# Patient Record
Sex: Female | Born: 1950 | Race: White | Hispanic: No | Marital: Married | Smoking: Never smoker
Health system: Western US, Academic
[De-identification: ages and names within clinical notes are randomized; demographics above are authoritative.]

---

## 2017-07-18 IMAGING — DX PELVIS 1 VIEW
1 series · 1 of 1 positions shown · non-contrast
Comparison: None

THORACIC SPINE 2 VIEWS, PELVIS 1 VIEW, LUMBAR SPINE 2 VIEW, 07/18/2017 [DATE]: 
CLINICAL INDICATION: 66-year-old female with previous laminectomy, low back 
pain 
and right leg pain
TECHNIQUE: 2 views thoracic spine, 2 views lumbosacral spine, and AP pelvis 
were 
performed

[AP]
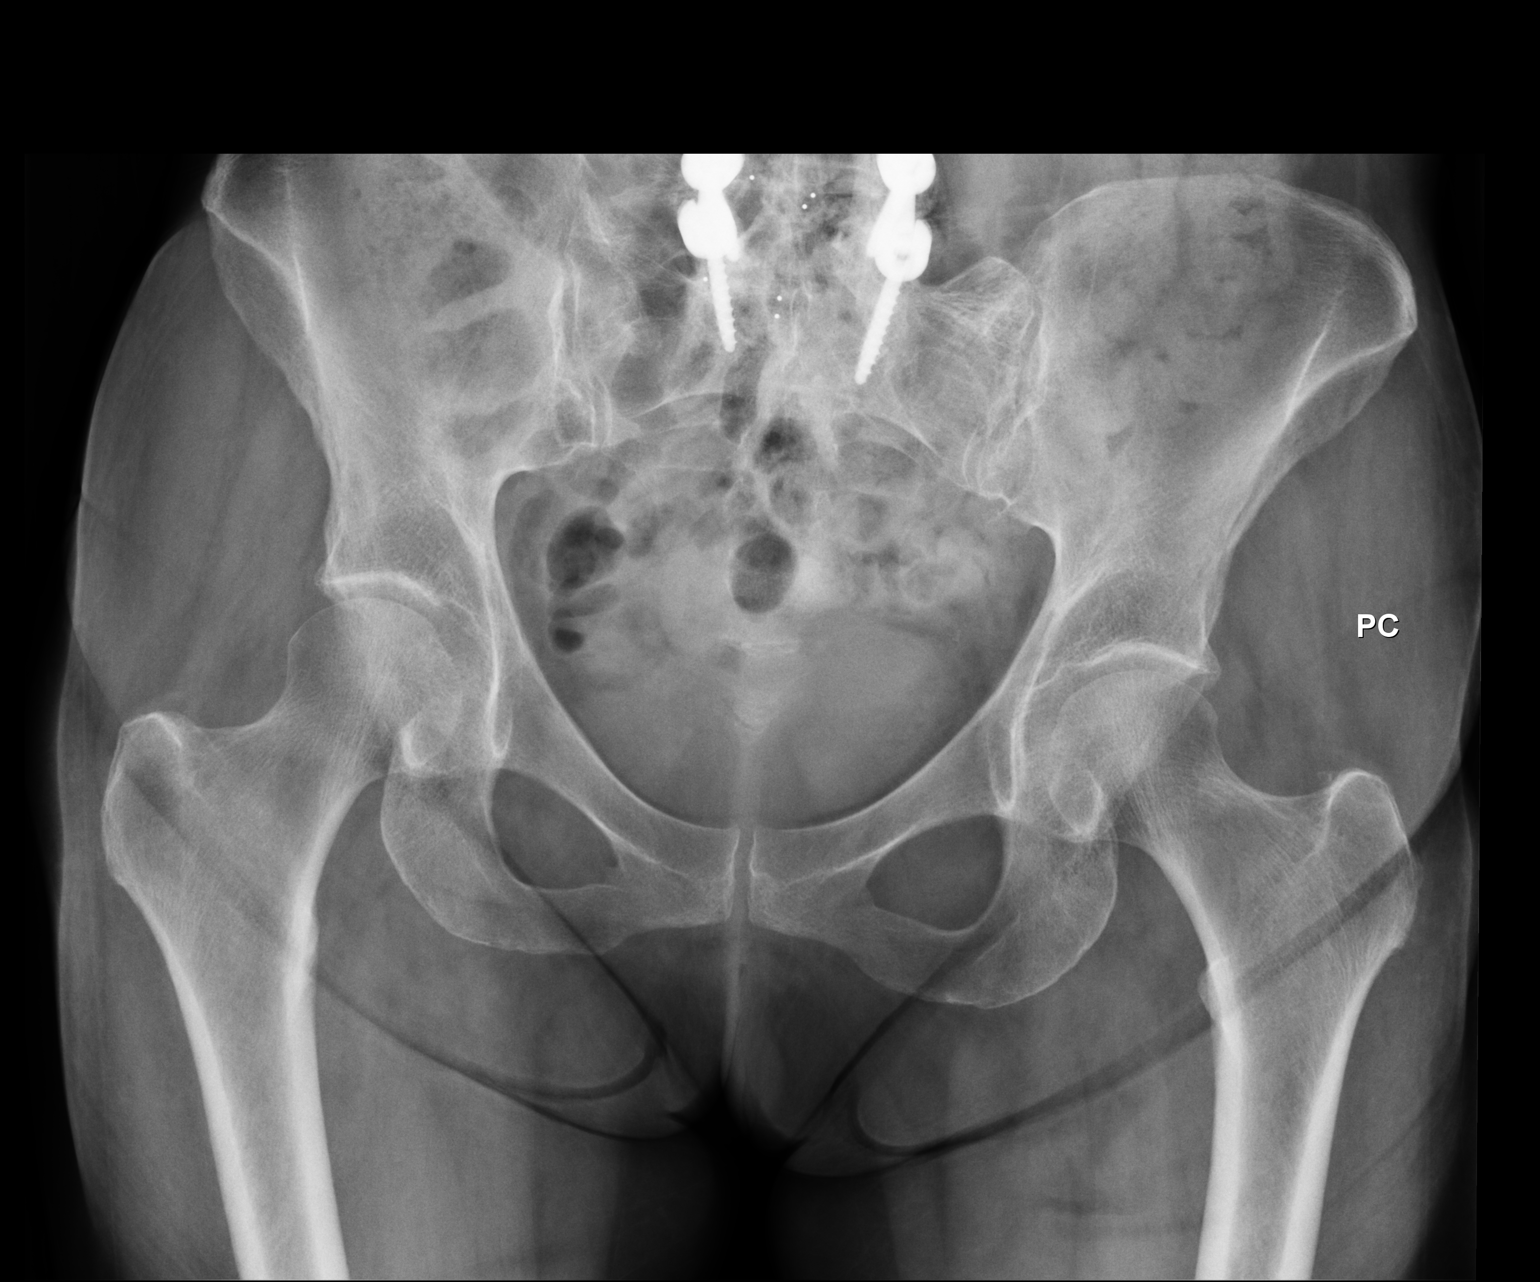

[1 of 1 positions shown; findings below may reference images not displayed]

FINDINGS: Thoracic spine demonstrates minimal dextroscoliosis. 
Vertebral body heights and disc space heights are maintained. 
There is minimal marginal spondylitic change of the dorsal vertebral bodies. 
There is bilateral medullary nephrocalcinosis in both kidneys. 
Imaged lungs are clear. 
Lower cervical spine shows degenerative facet arthritis left greater than 
right. 
2 views lumbosacral spine demonstrate previous posterior decompression and 
transpedicular fusion L3 through S1. There appear to be 6 nonrib-bearing 
lumbar-type vertebral bodies. No hardware failure identified. Intervertebral 
disc spacers are present at L3/L4, L4/L5, and L5/S1. 
No compression fracture. No malalignment. 
SI joints are open. 
AP pelvis demonstrates preserved hip joint spaces. No focal lytic or blastic 
lesion. 
Asymmetry of the lower lumbar spine with partial sacralization of the right 
half 
of the lowest most lumbar vertebral body.
IMPRESSION: Bilateral medullary nephrocalcinosis of the kidneys. Consider further renal 
workup. 
6 nonrib bearing lumbar type vertebral bodies with previous transpedicular 
fusion and posterior decompression from what appears to be L3-S1 or the 3 
lowest 
nonrib bearing type vertebral bodies with disc spacers. No hardware failure. 
Correlation with MRI may be helpful to further assess for the numbering schema 
and the hardware positioning. The lowest or caudal most lumbar nonrib-bearing 
type vertebral body appears to be partially sacralized with slight asymmetry 
of 
the bony pelvis secondarily. 
No acute finding of the thoracic spine. Mild degenerative spondylitic change.

## 2017-07-18 IMAGING — DX HAND 3 VIEWS LEFT
1 series · 3 of 3 positions shown · non-contrast
Comparison: None

HAND 3 VIEWS RIGHT, HAND 3 VIEWS LEFT, 07/18/2017 [DATE]: 
CLINICAL INDICATION: 66-year-old female with right hand pain
TECHNIQUE: 3 views of the bilateral hands were performed

[Series 1: PA · U · 0.14mm/px · 3 of 3 slices shown]
[im 1/3]
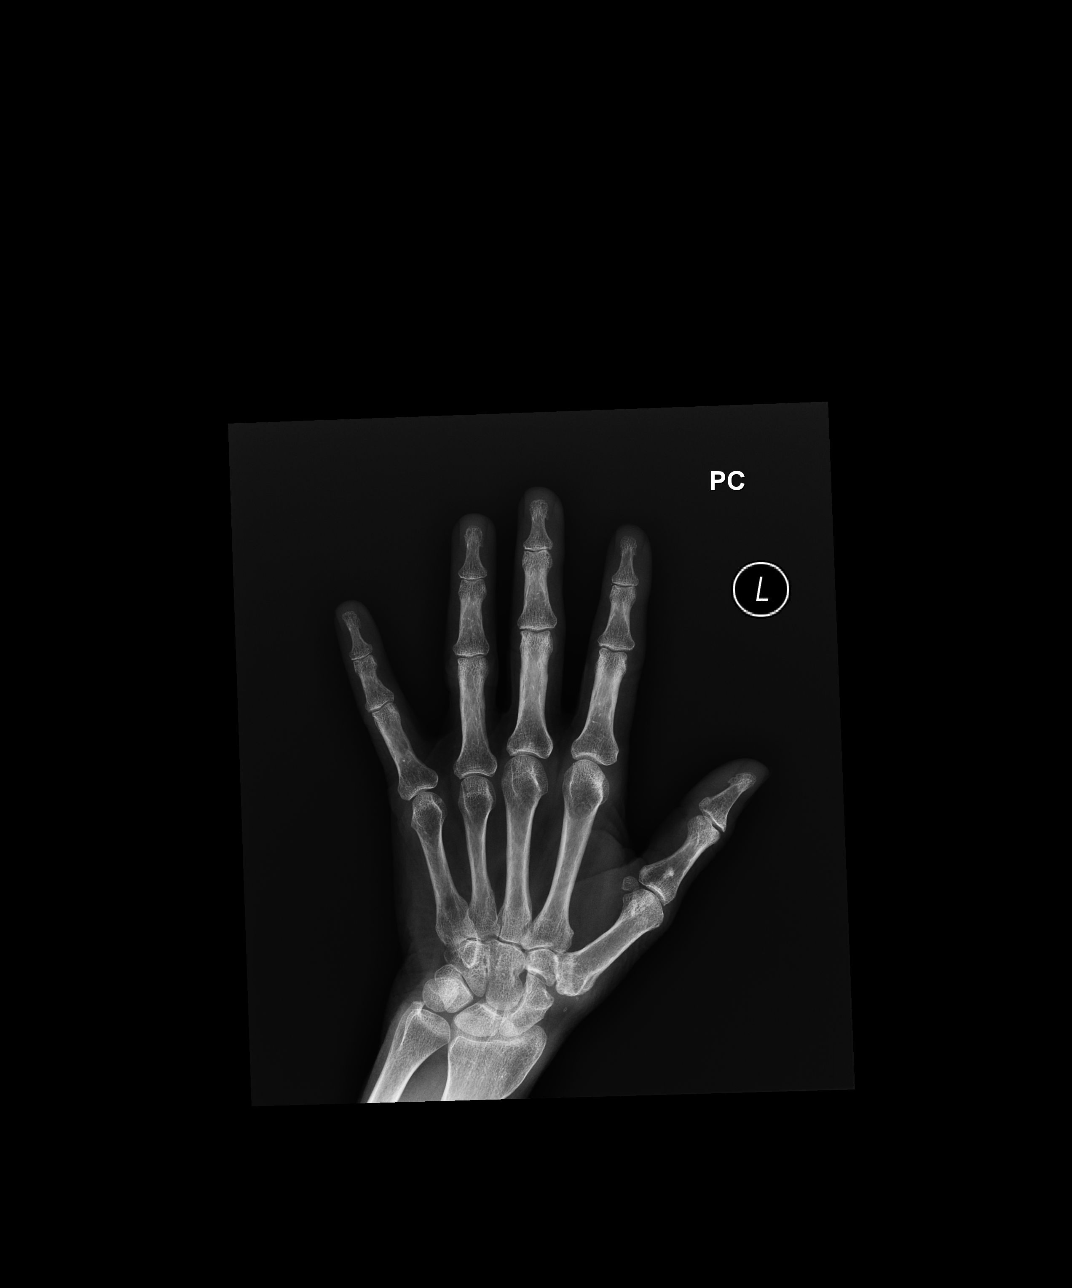
[im 2/3]
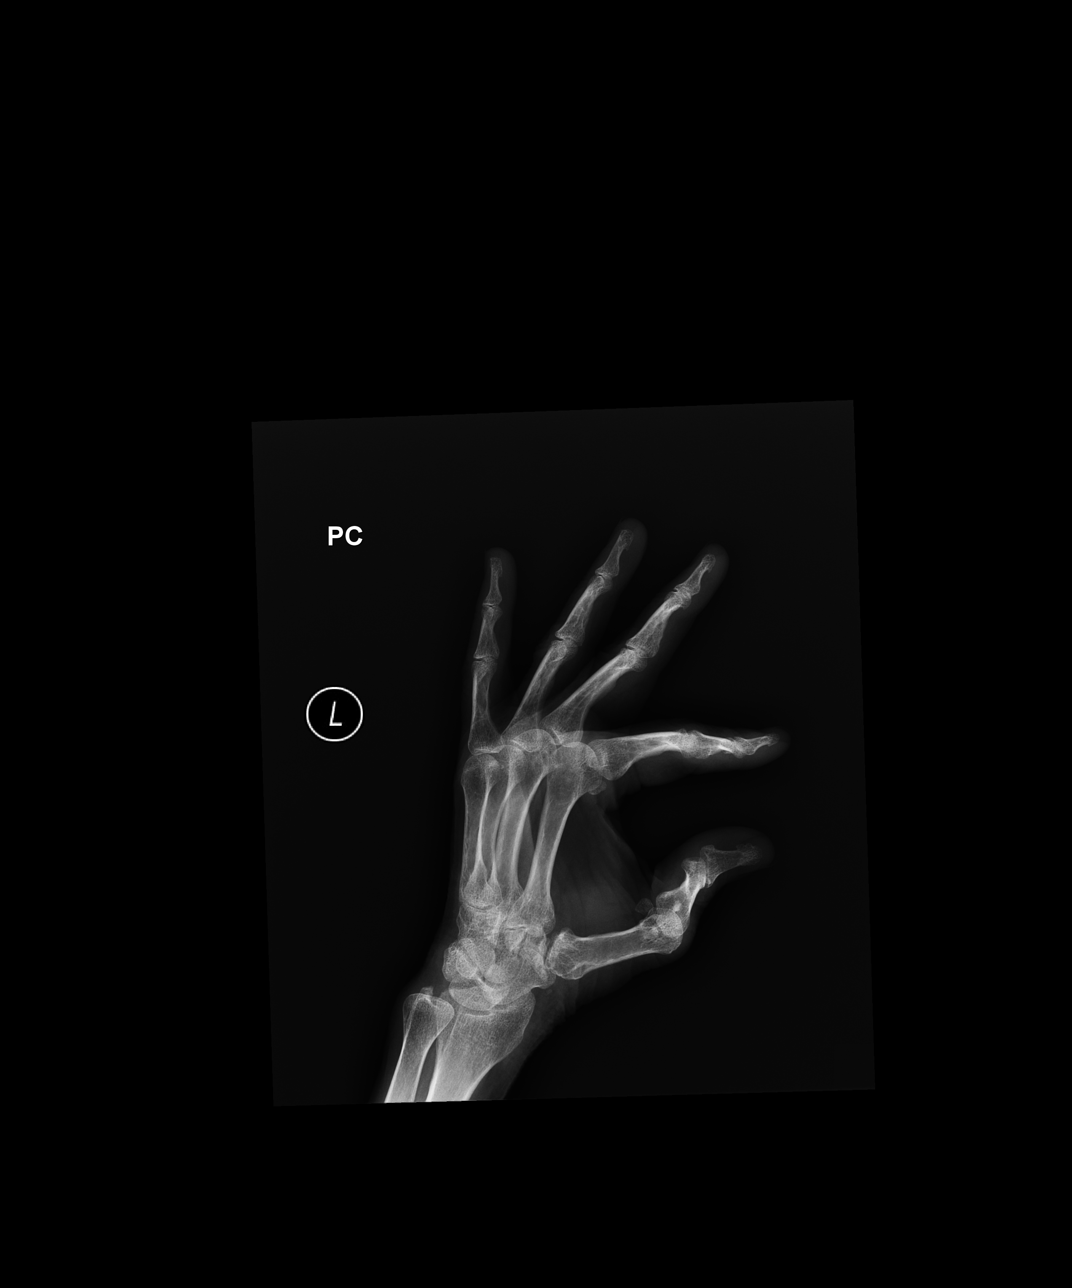
[im 3/3]
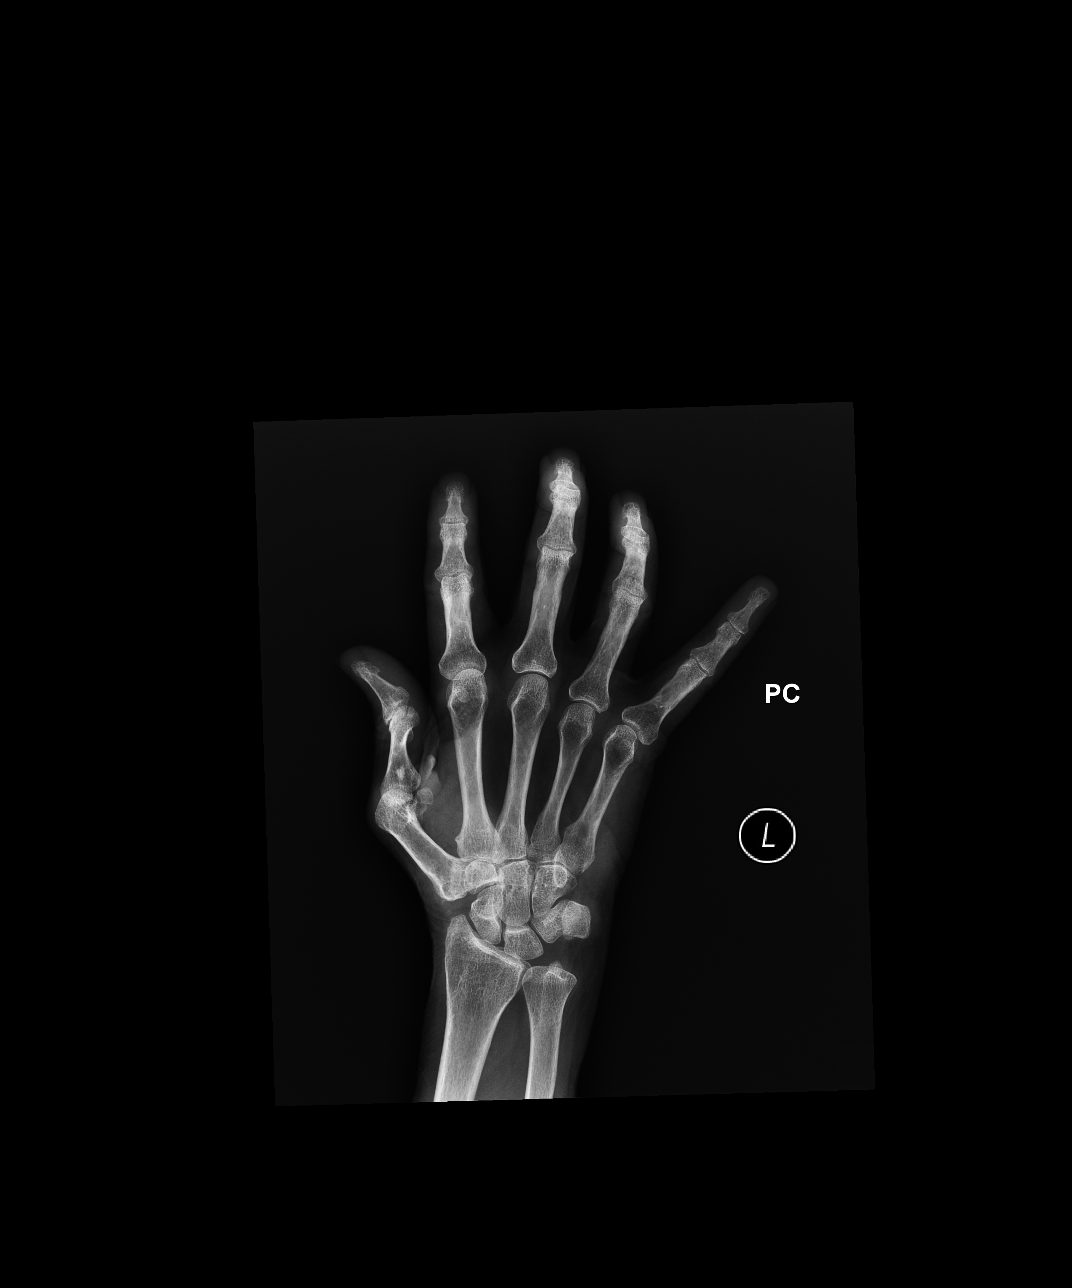

[3 of 3 positions shown; findings below may reference images not displayed]

FINDINGS: There is mild global osteopenia. 
Right hand demonstrates minimal scattered degenerative joint space narrowing 
of 
the second and third DIP and PIP joints. No erosion. 
Mild right first carpometacarpal degenerative arthritis as well. No soft 
tissue 
swelling or subluxation. 
On the left hand, there is minimal second and third DIP and PIP joint base 
narrowing and mild MCP joint space narrowing. No erosion. 
Mild dorsal osteophytic spurring of the third digit at the distal phalanx.
IMPRESSION: Mild degenerative arthritis bilateral hands second and third digits primarily 
and right first carpometacarpal joint. 
No plain film evidence of erosive or inflammatory arthritis.

## 2017-07-18 IMAGING — DX LUMBAR SPINE 2 VIEW
1 series · 2 of 2 positions shown · non-contrast
Comparison: None

THORACIC SPINE 2 VIEWS, PELVIS 1 VIEW, LUMBAR SPINE 2 VIEW, 07/18/2017 [DATE]: 
CLINICAL INDICATION: 66-year-old female with previous laminectomy, low back 
pain 
and right leg pain
TECHNIQUE: 2 views thoracic spine, 2 views lumbosacral spine, and AP pelvis 
were 
performed

[Series 1: AP · U · 0.14mm/px · 2 of 2 slices shown]
[im 1/2]
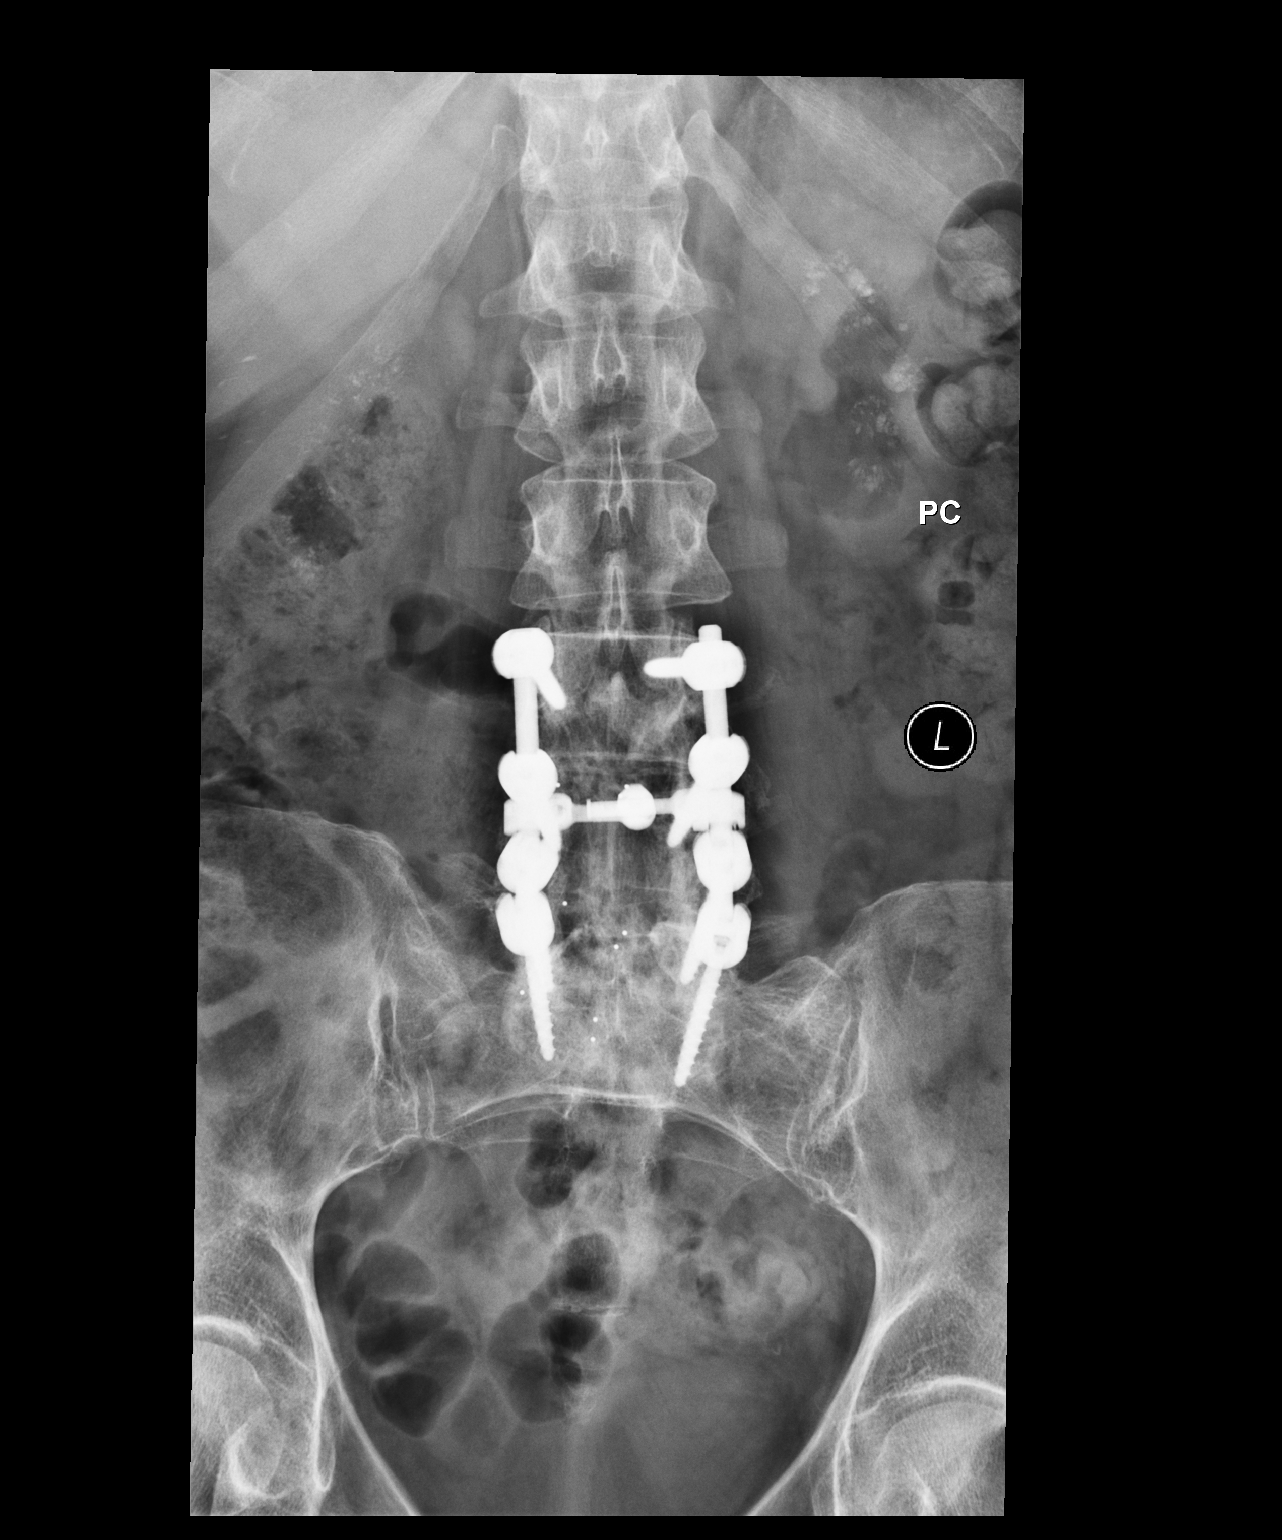
[im 2/2]
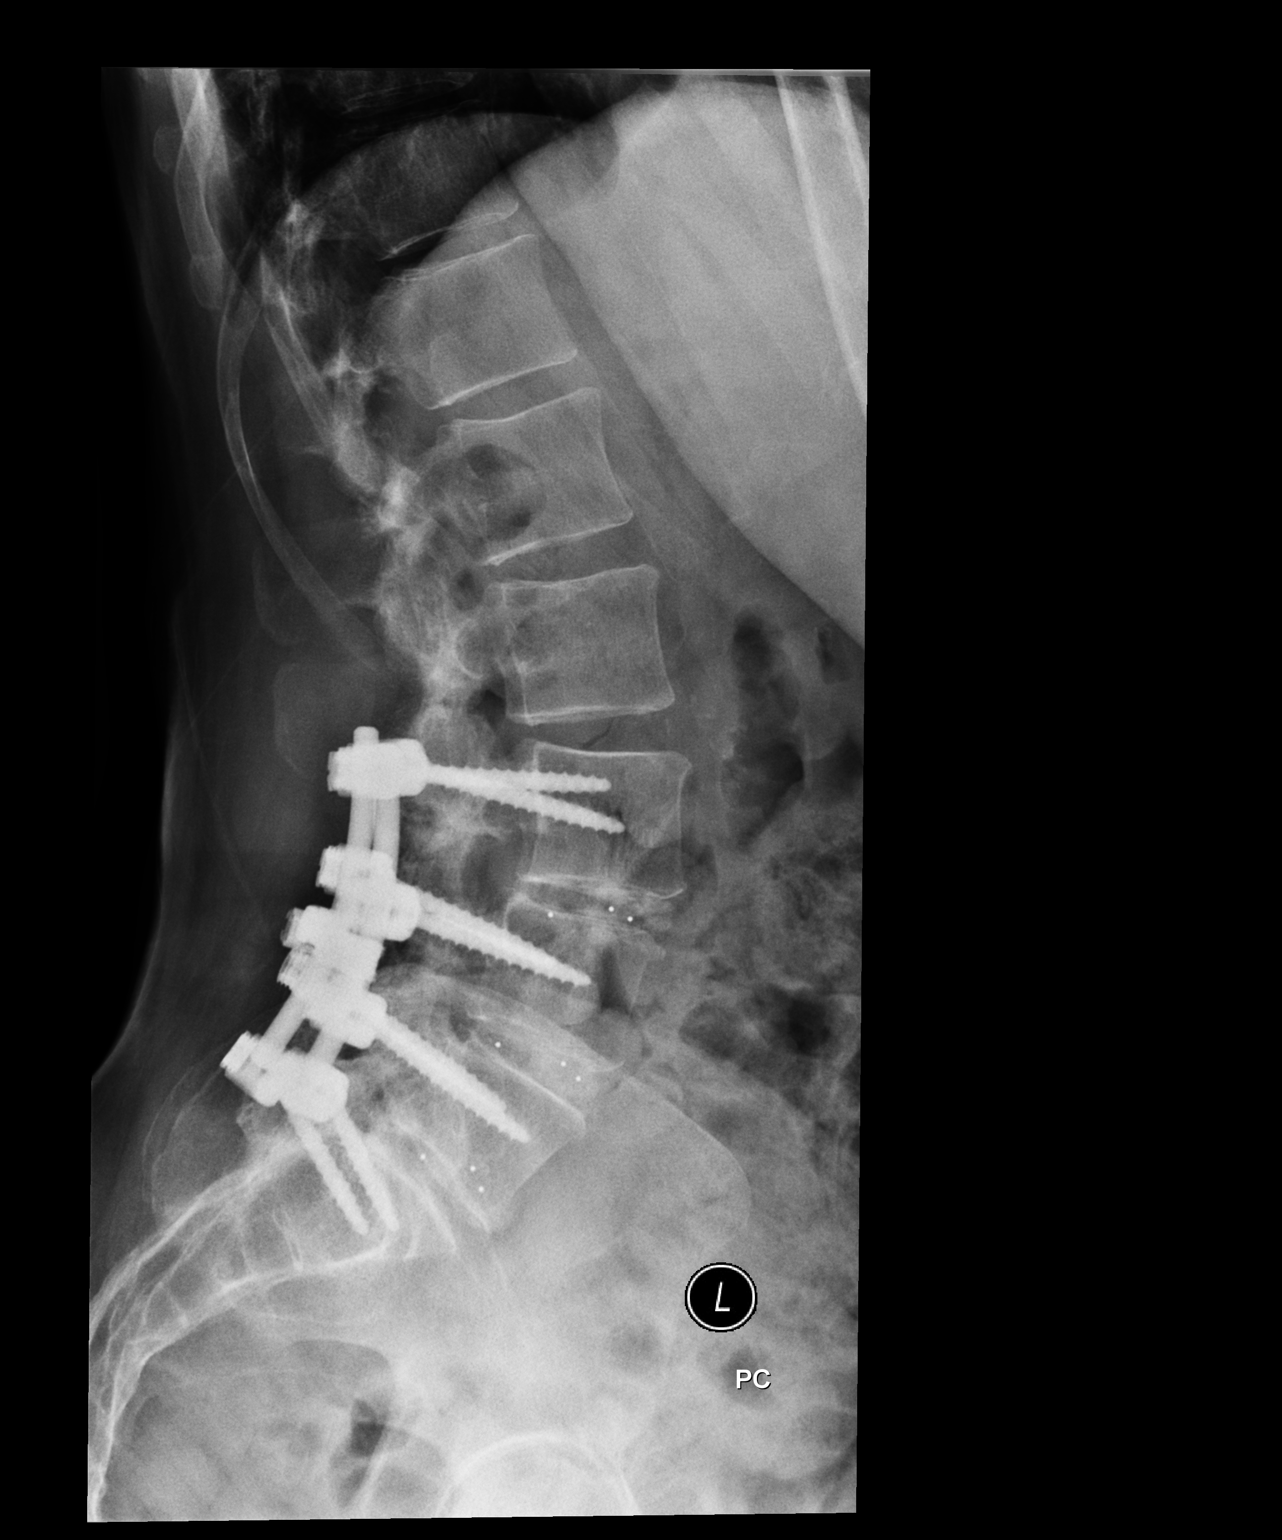

[2 of 2 positions shown; findings below may reference images not displayed]

FINDINGS: Thoracic spine demonstrates minimal dextroscoliosis. 
Vertebral body heights and disc space heights are maintained. 
There is minimal marginal spondylitic change of the dorsal vertebral bodies. 
There is bilateral medullary nephrocalcinosis in both kidneys. 
Imaged lungs are clear. 
Lower cervical spine shows degenerative facet arthritis left greater than 
right. 
2 views lumbosacral spine demonstrate previous posterior decompression and 
transpedicular fusion L3 through S1. There appear to be 6 nonrib-bearing 
lumbar-type vertebral bodies. No hardware failure identified. Intervertebral 
disc spacers are present at L3/L4, L4/L5, and L5/S1. 
No compression fracture. No malalignment. 
SI joints are open. 
AP pelvis demonstrates preserved hip joint spaces. No focal lytic or blastic 
lesion. 
Asymmetry of the lower lumbar spine with partial sacralization of the right 
half 
of the lowest most lumbar vertebral body.
IMPRESSION: Bilateral medullary nephrocalcinosis of the kidneys. Consider further renal 
workup. 
6 nonrib bearing lumbar type vertebral bodies with previous transpedicular 
fusion and posterior decompression from what appears to be L3-S1 or the 3 
lowest 
nonrib bearing type vertebral bodies with disc spacers. No hardware failure. 
Correlation with MRI may be helpful to further assess for the numbering schema 
and the hardware positioning. The lowest or caudal most lumbar nonrib-bearing 
type vertebral body appears to be partially sacralized with slight asymmetry 
of 
the bony pelvis secondarily. 
No acute finding of the thoracic spine. Mild degenerative spondylitic change.

## 2017-07-18 IMAGING — DX HAND 3 VIEWS RIGHT
1 series · 6 of 6 positions shown · non-contrast
Comparison: None

HAND 3 VIEWS RIGHT, HAND 3 VIEWS LEFT, 07/18/2017 [DATE]: 
CLINICAL INDICATION: 66-year-old female with right hand pain
TECHNIQUE: 3 views of the bilateral hands were performed

[Series 1: PA · U · 0.14mm/px · 6 of 6 slices shown]
[im 1/6]
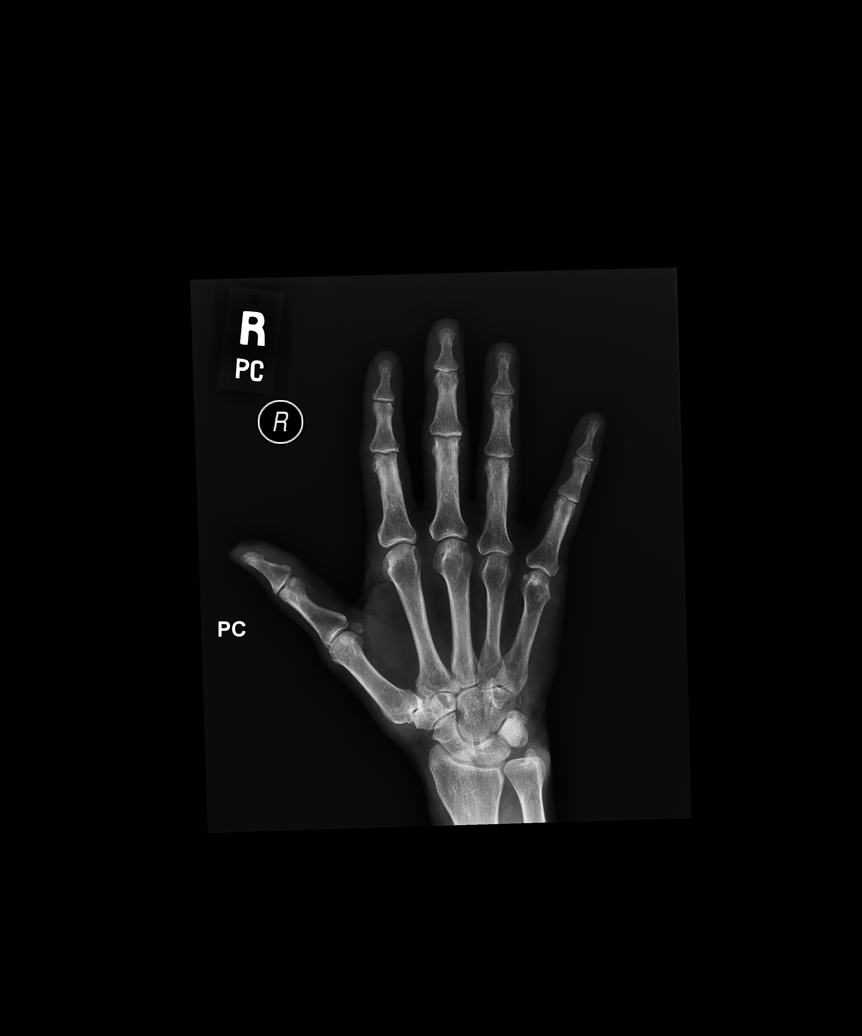
[im 2/6]
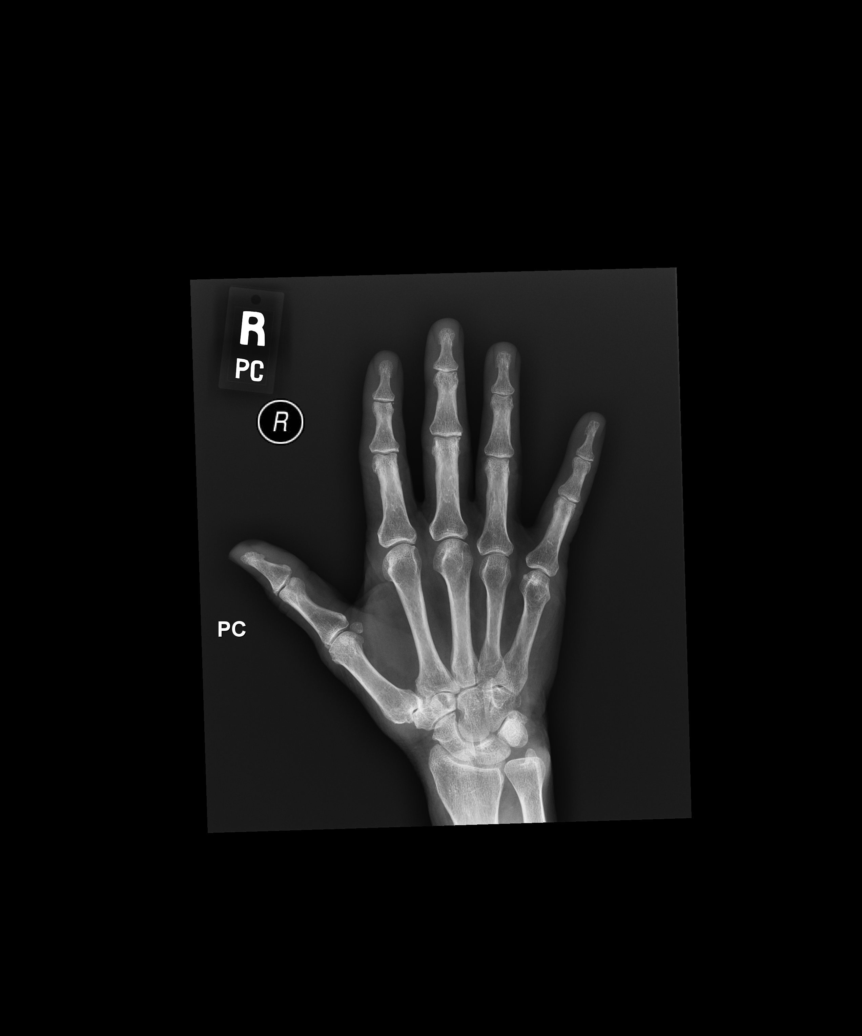
[im 3/6]
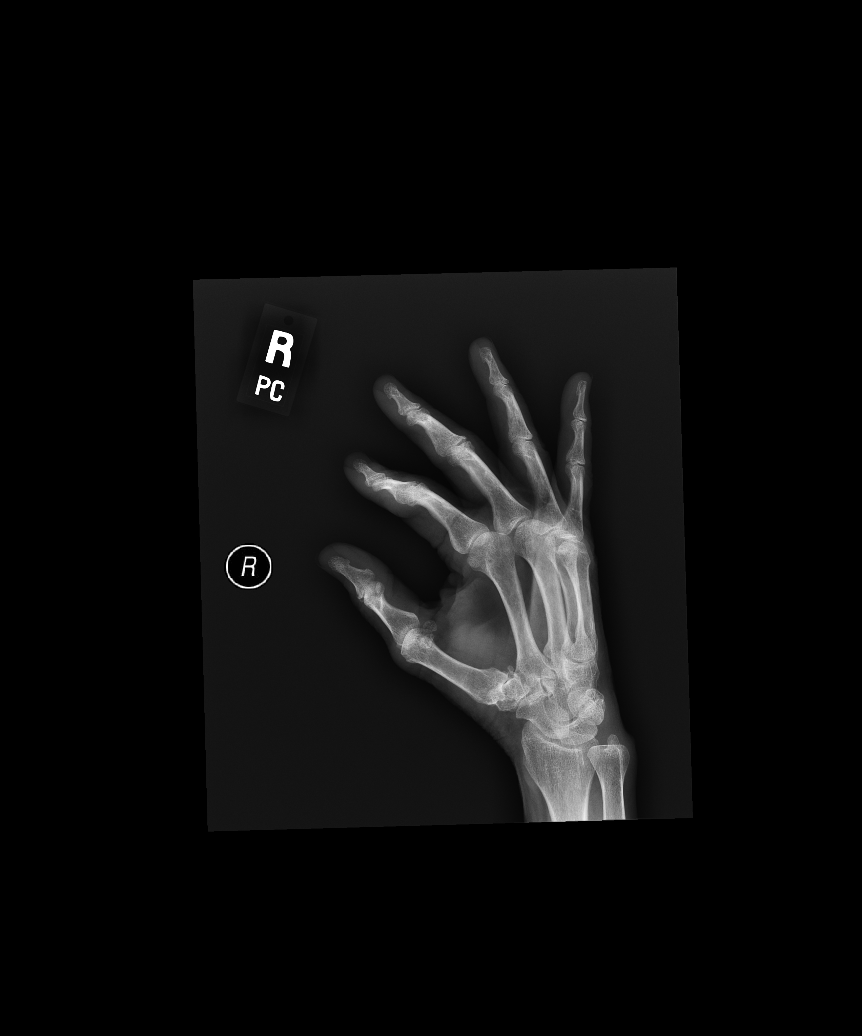
[im 4/6]
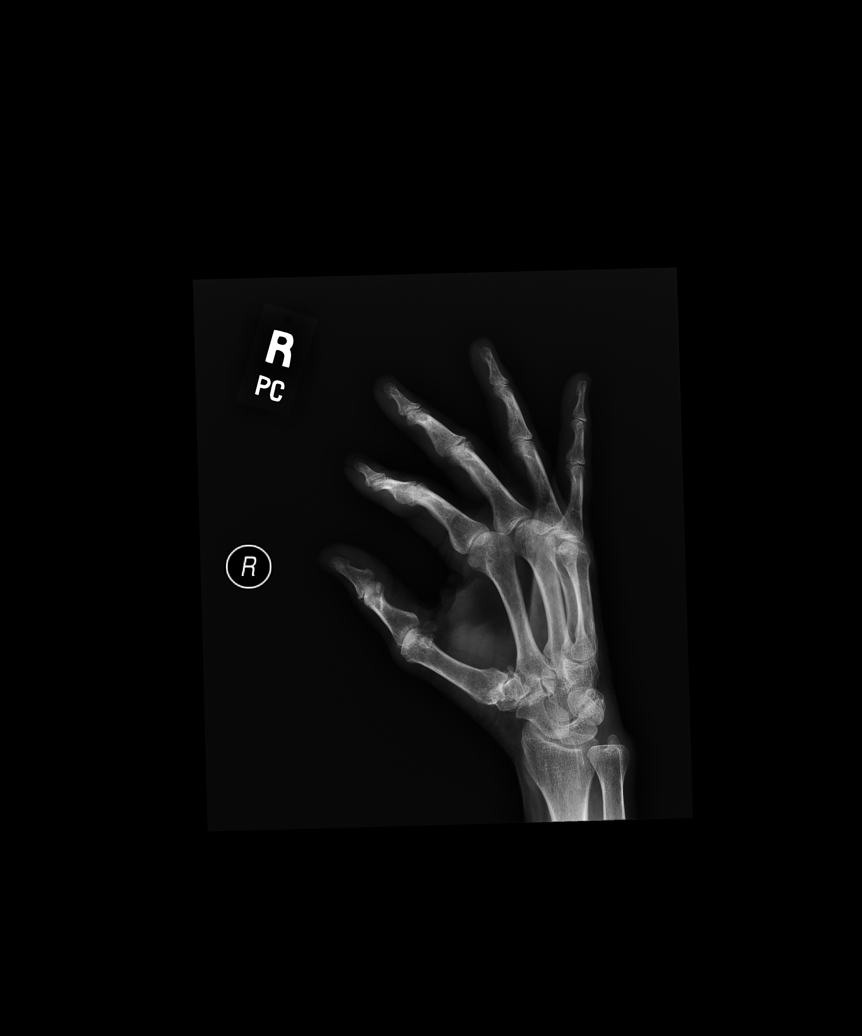
[im 5/6]
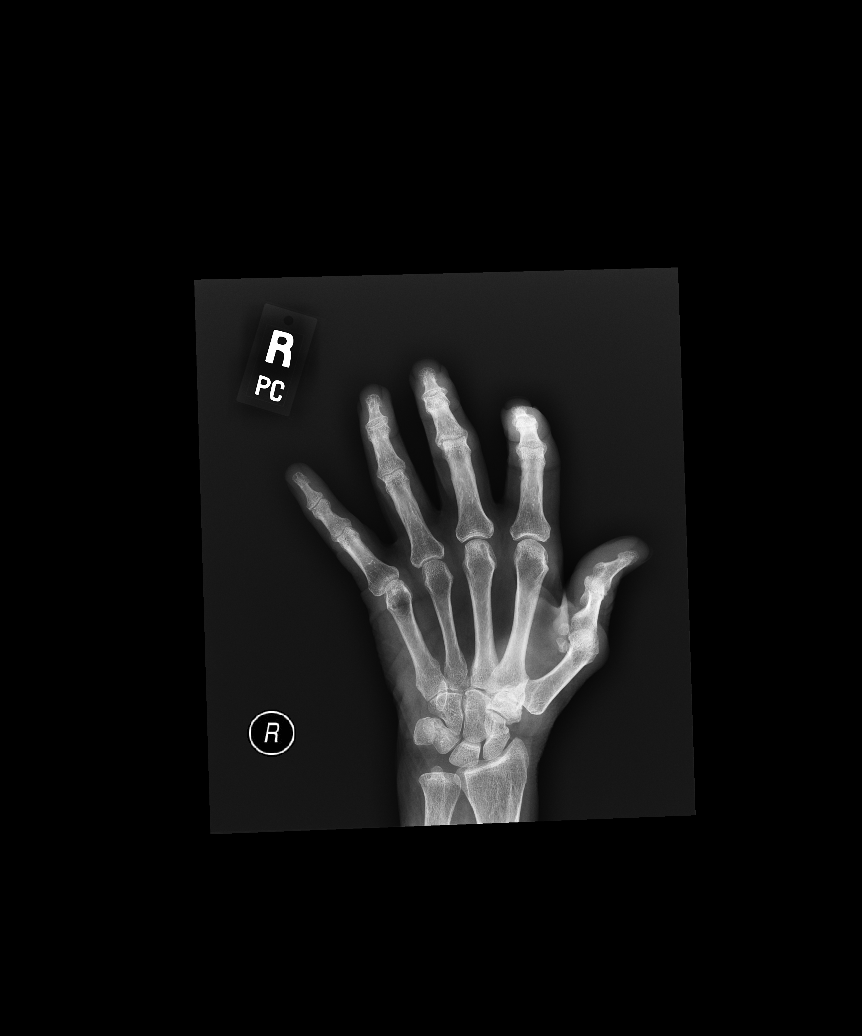
[im 6/6]
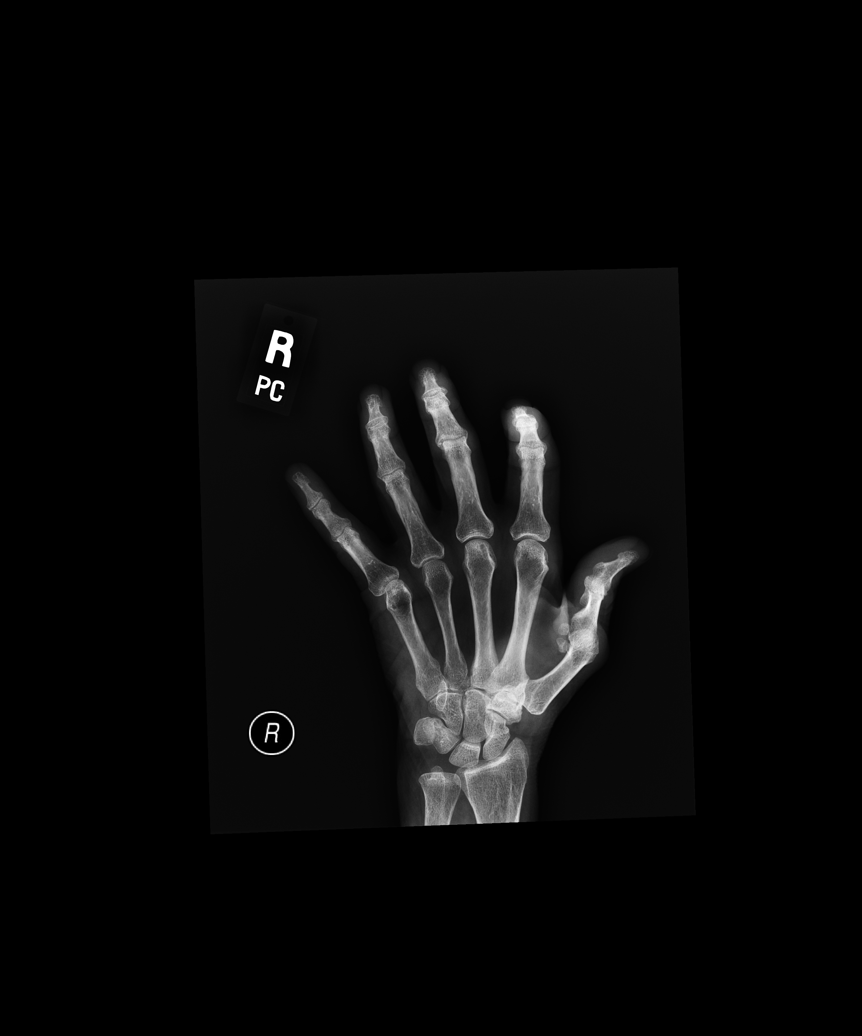

[6 of 6 positions shown; findings below may reference images not displayed]

FINDINGS: There is mild global osteopenia. 
Right hand demonstrates minimal scattered degenerative joint space narrowing 
of 
the second and third DIP and PIP joints. No erosion. 
Mild right first carpometacarpal degenerative arthritis as well. No soft 
tissue 
swelling or subluxation. 
On the left hand, there is minimal second and third DIP and PIP joint base 
narrowing and mild MCP joint space narrowing. No erosion. 
Mild dorsal osteophytic spurring of the third digit at the distal phalanx.
IMPRESSION: Mild degenerative arthritis bilateral hands second and third digits primarily 
and right first carpometacarpal joint. 
No plain film evidence of erosive or inflammatory arthritis.

## 2017-07-18 IMAGING — DX THORACIC SPINE 2 VIEWS
1 series · 4 of 4 positions shown · non-contrast
Comparison: None

THORACIC SPINE 2 VIEWS, PELVIS 1 VIEW, LUMBAR SPINE 2 VIEW, 07/18/2017 [DATE]: 
CLINICAL INDICATION: 66-year-old female with previous laminectomy, low back 
pain 
and right leg pain
TECHNIQUE: 2 views thoracic spine, 2 views lumbosacral spine, and AP pelvis 
were 
performed

[Series 1: AP · U · 0.14mm/px · 4 of 4 slices shown]
[im 1/4]
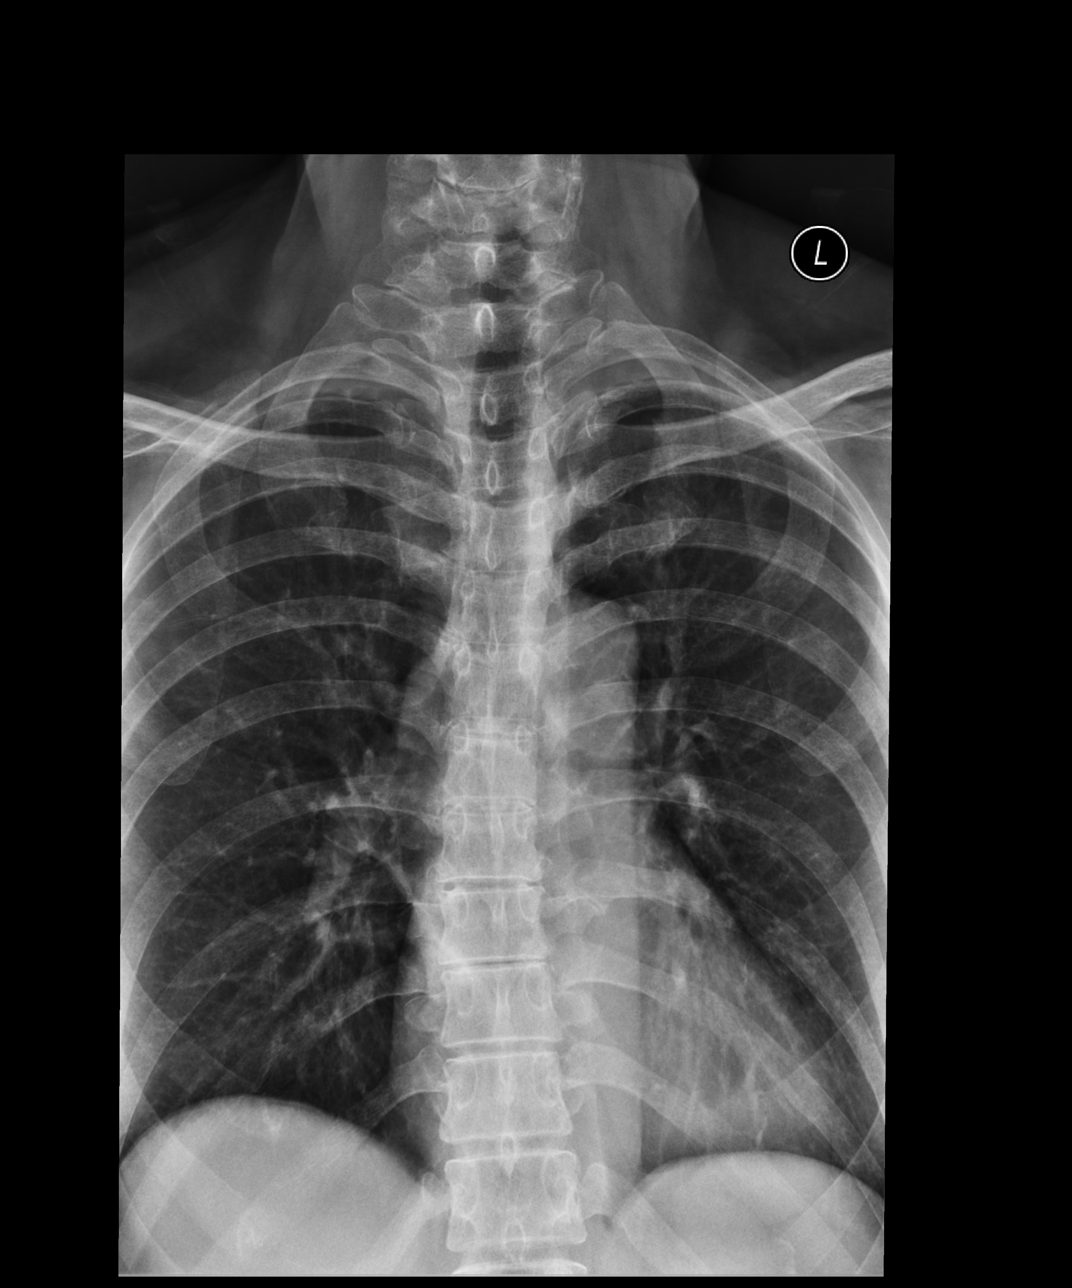
[im 2/4]
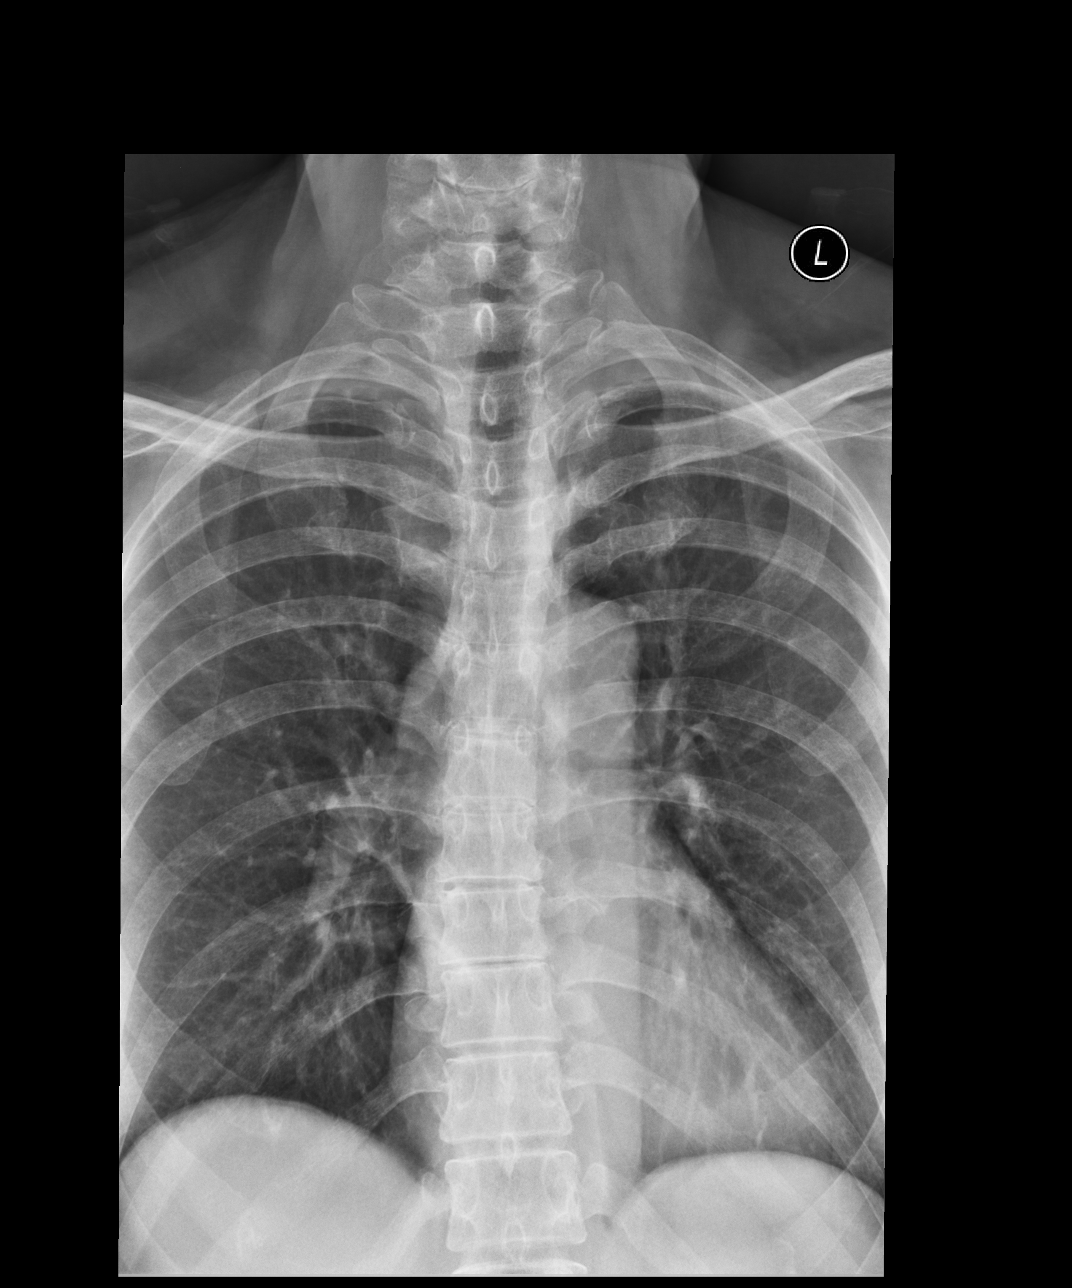
[im 3/4]
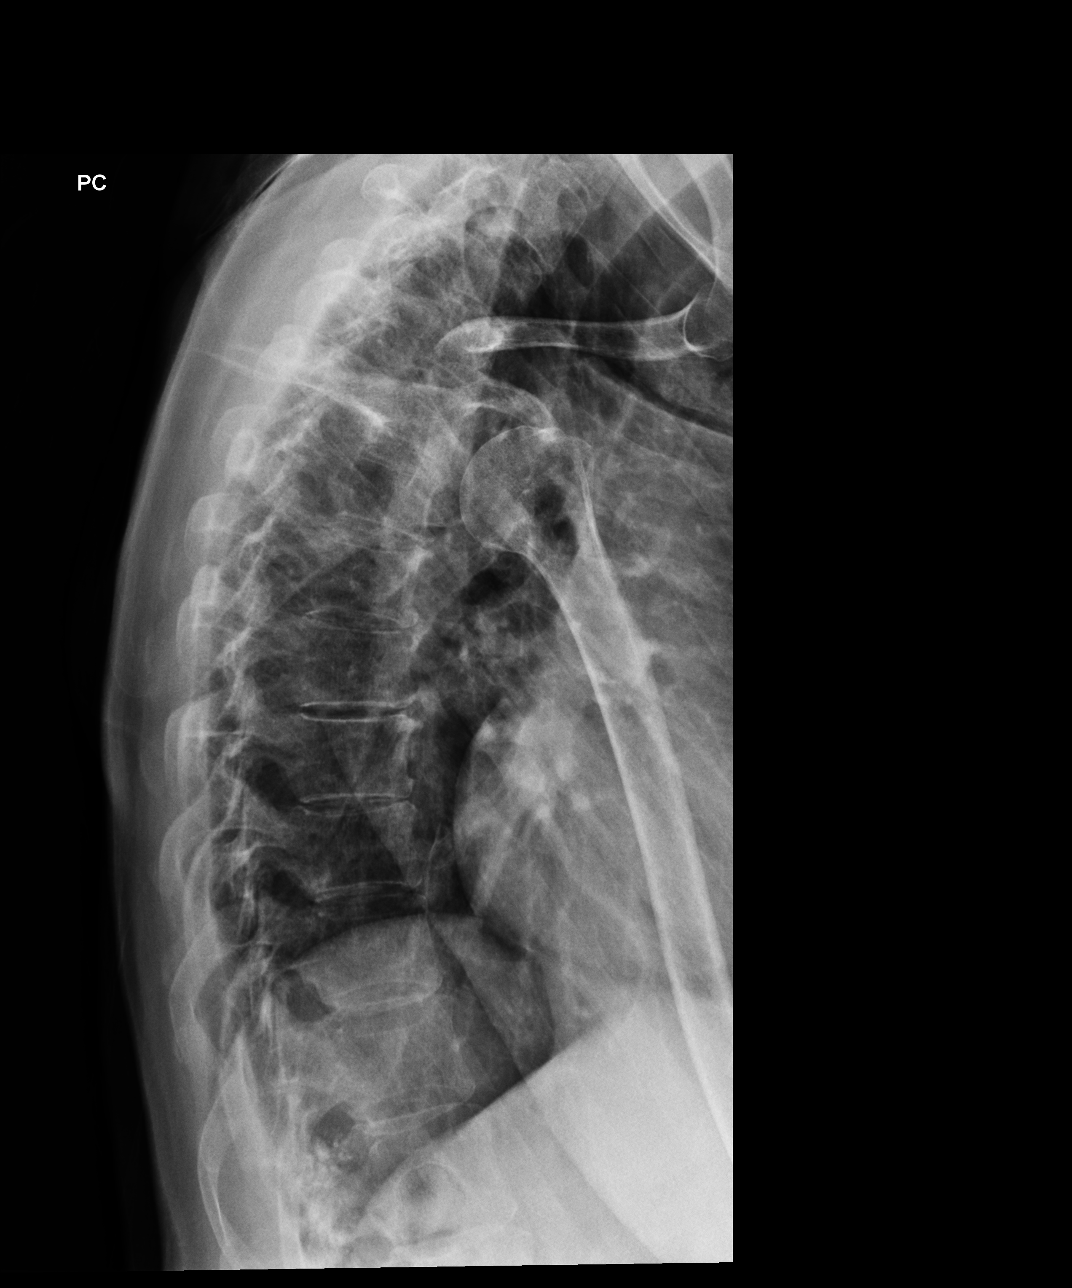
[im 4/4]
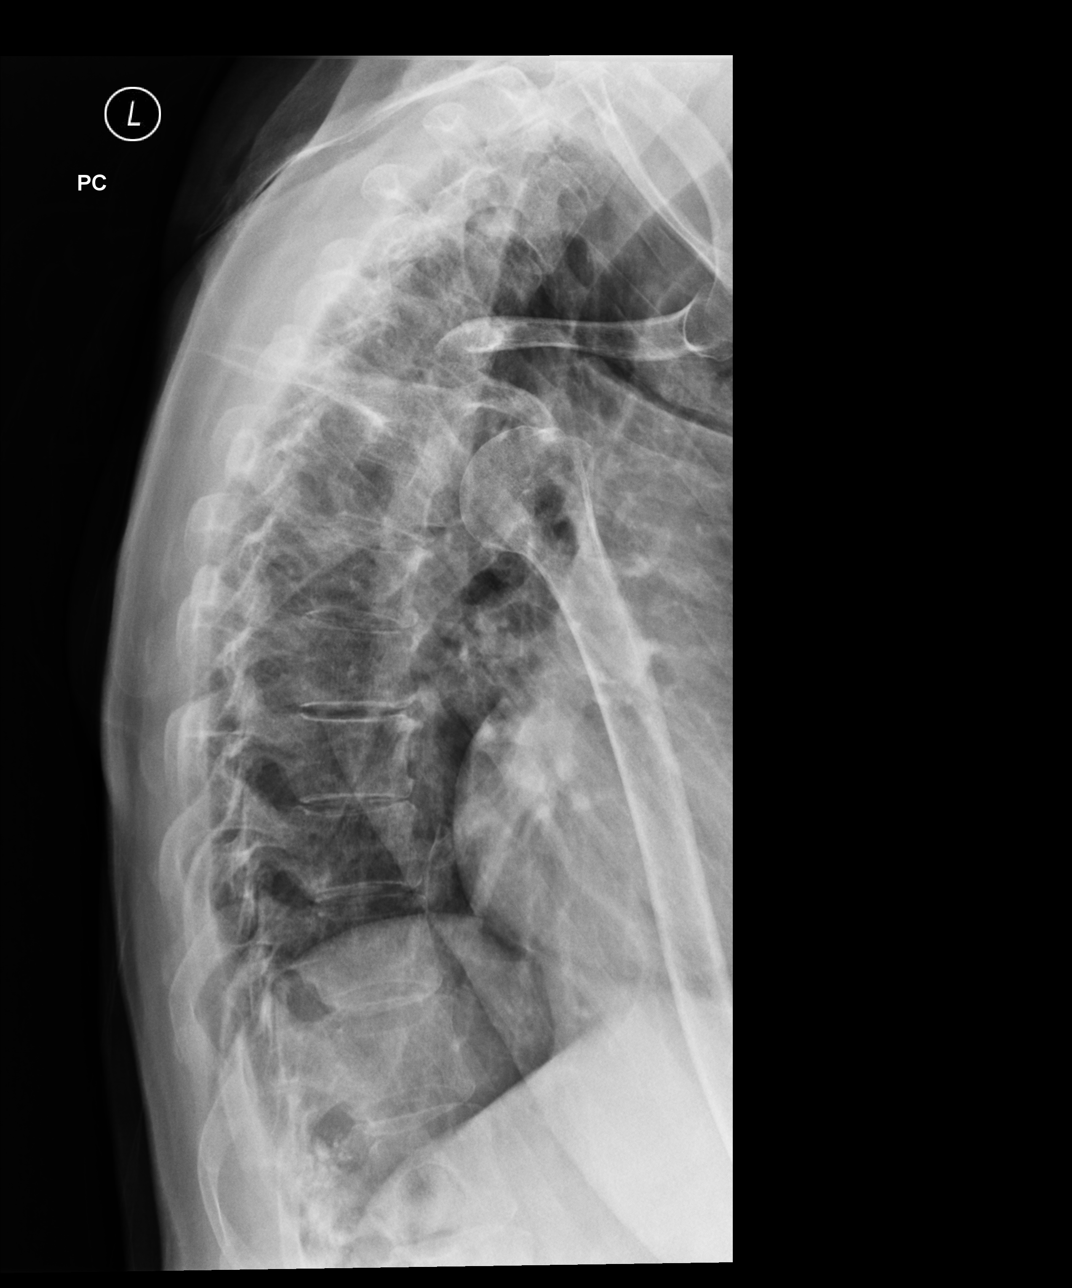

[4 of 4 positions shown; findings below may reference images not displayed]

FINDINGS: Thoracic spine demonstrates minimal dextroscoliosis. 
Vertebral body heights and disc space heights are maintained. 
There is minimal marginal spondylitic change of the dorsal vertebral bodies. 
There is bilateral medullary nephrocalcinosis in both kidneys. 
Imaged lungs are clear. 
Lower cervical spine shows degenerative facet arthritis left greater than 
right. 
2 views lumbosacral spine demonstrate previous posterior decompression and 
transpedicular fusion L3 through S1. There appear to be 6 nonrib-bearing 
lumbar-type vertebral bodies. No hardware failure identified. Intervertebral 
disc spacers are present at L3/L4, L4/L5, and L5/S1. 
No compression fracture. No malalignment. 
SI joints are open. 
AP pelvis demonstrates preserved hip joint spaces. No focal lytic or blastic 
lesion. 
Asymmetry of the lower lumbar spine with partial sacralization of the right 
half 
of the lowest most lumbar vertebral body.
IMPRESSION: Bilateral medullary nephrocalcinosis of the kidneys. Consider further renal 
workup. 
6 nonrib bearing lumbar type vertebral bodies with previous transpedicular 
fusion and posterior decompression from what appears to be L3-S1 or the 3 
lowest 
nonrib bearing type vertebral bodies with disc spacers. No hardware failure. 
Correlation with MRI may be helpful to further assess for the numbering schema 
and the hardware positioning. The lowest or caudal most lumbar nonrib-bearing 
type vertebral body appears to be partially sacralized with slight asymmetry 
of 
the bony pelvis secondarily. 
No acute finding of the thoracic spine. Mild degenerative spondylitic change.

## 2018-07-15 IMAGING — CT CT PELVIS WITHOUT CONTRAST
2 of 4 series · 16 of 46 positions shown, 18 images · non-contrast
Comparison: Comparison was made to the prior exam(s) within the last 12 months 
dated  x-ray of the pelvis on July 18, 2017.

CT PELVIS WITHOUT CONTRAST, 07/15/2018 [DATE]: 
CLINICAL INDICATION:  Low back pain. History of laminectomy and fusion of the 
lower lumbar spine 
A search for DICOM formatted images was conducted for prior CT imaging studies 
completed at a non-affiliated media free facility.
TECHNIQUE: The pelvis was scanned from lower abdomen though the pubic rami 
without contrast on a high-resolution CT scanner using dose reduction 
techniques.  Routine MPR reconstructions were performed.

[Series 2: pelvis/hip 1.5 i31s 2 · axial · 0.66mm/px · z∈[-275,-66]mm · 13 of 243 slices shown, 15 images]
[im 17/243  soft-tissue]
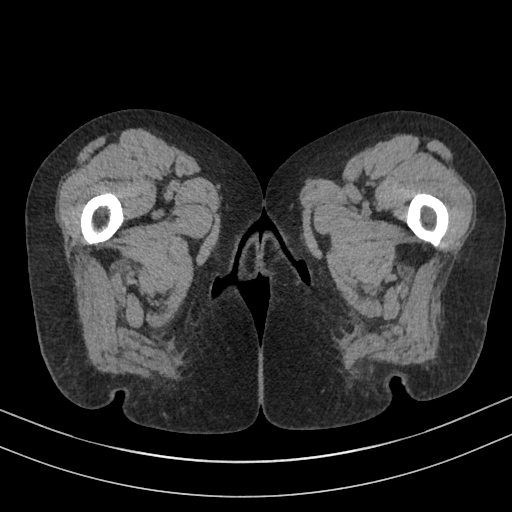
[im 17/243  bone]
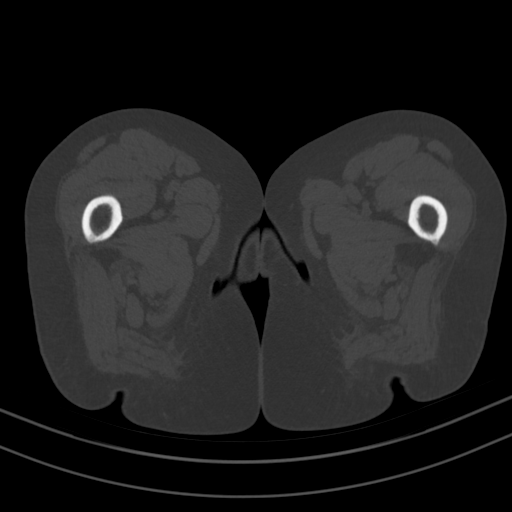
[im 33/243  soft-tissue]
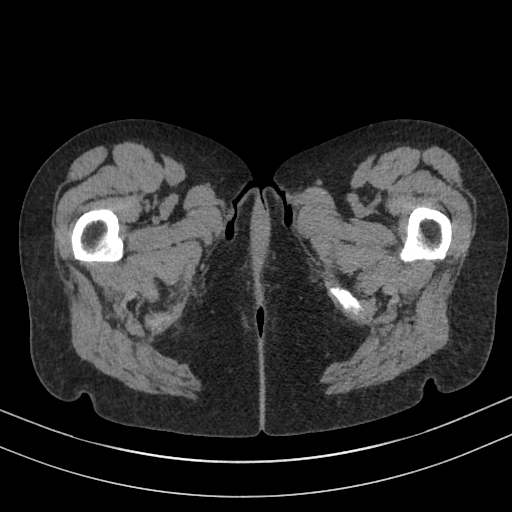
[im 49/243  soft-tissue]
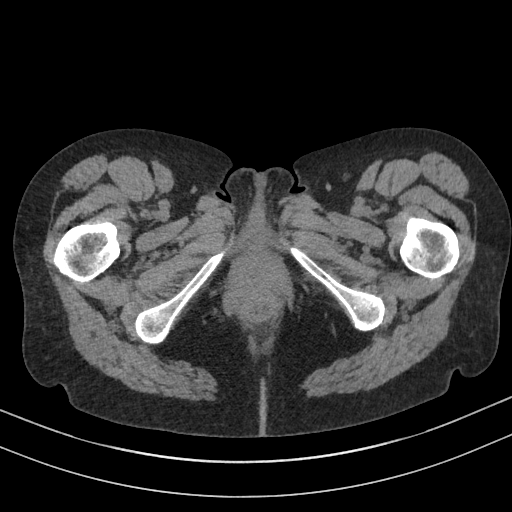
[im 65/243  soft-tissue]
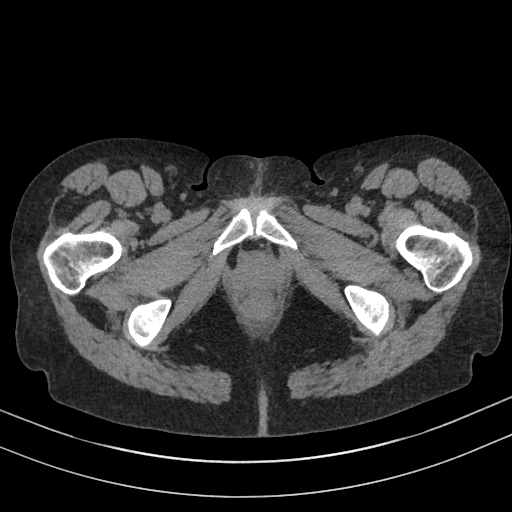
[im 81/243  soft-tissue]
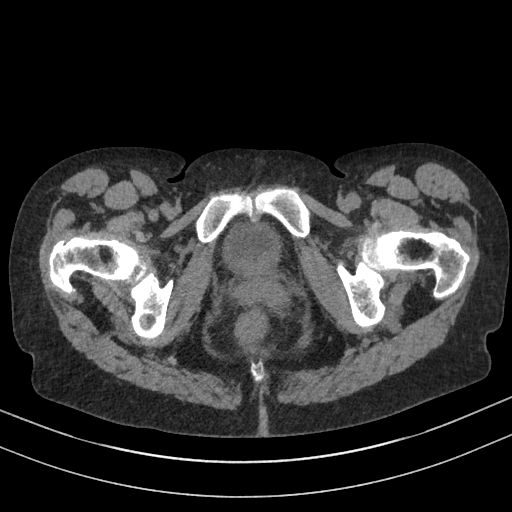
[im 97/243  soft-tissue]
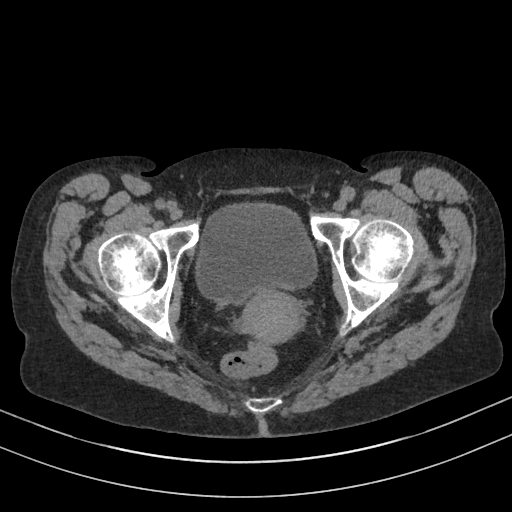
[im 130/243  soft-tissue]
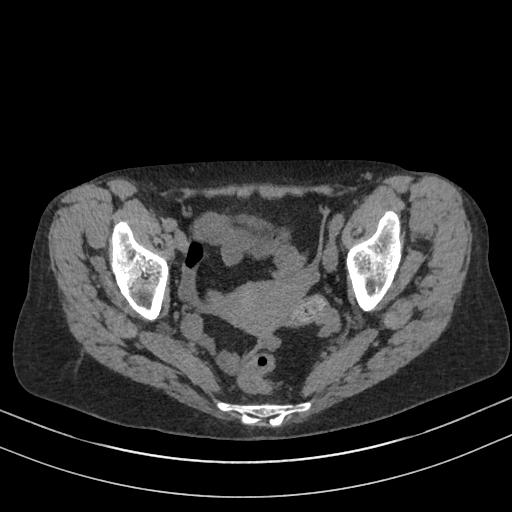
[im 146/243  soft-tissue]
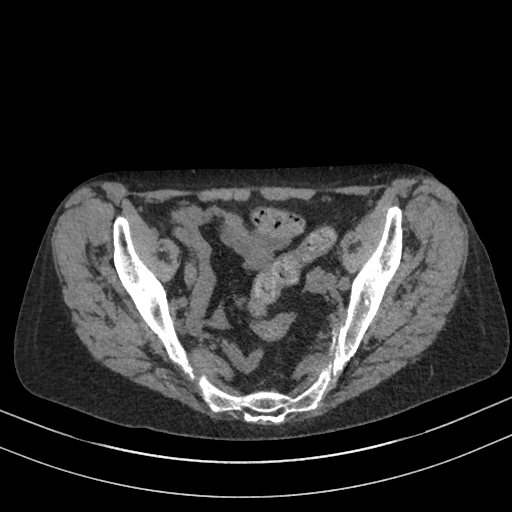
[im 162/243  soft-tissue]
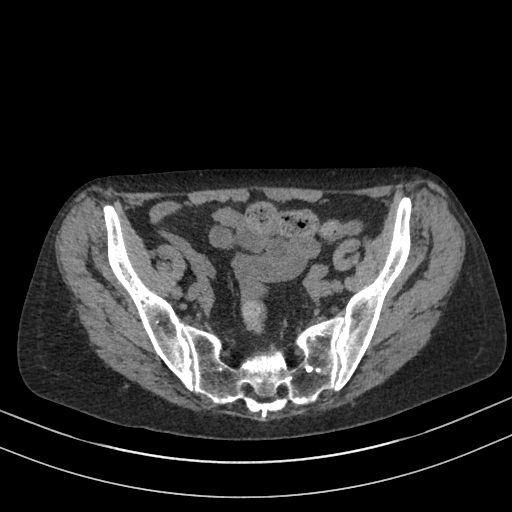
[im 162/243  bone]
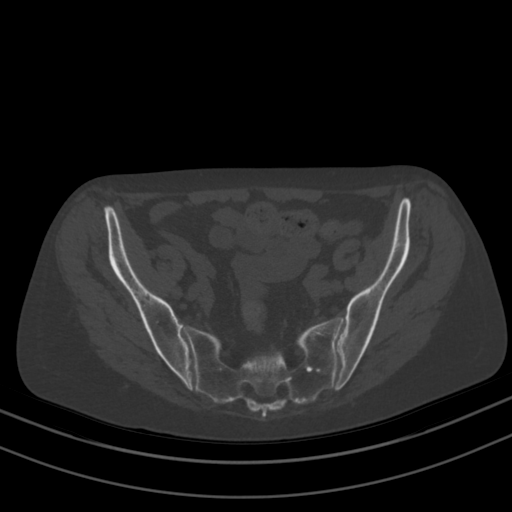
[im 178/243  soft-tissue]
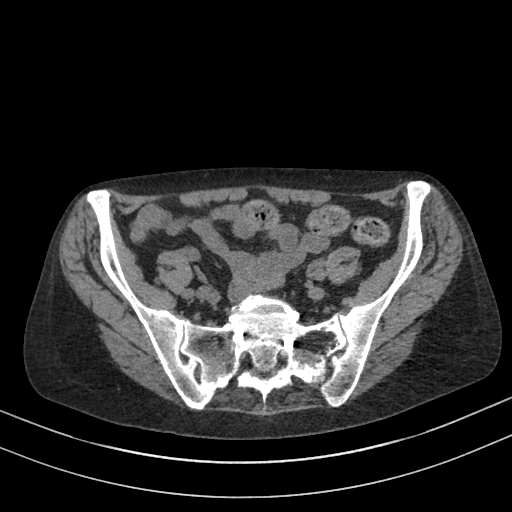
[im 194/243  soft-tissue]
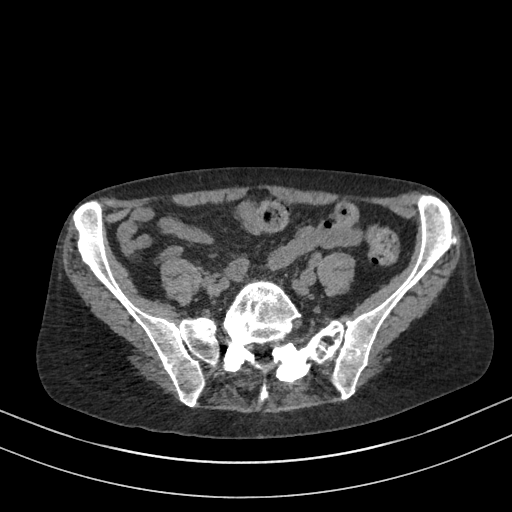
[im 210/243  soft-tissue]
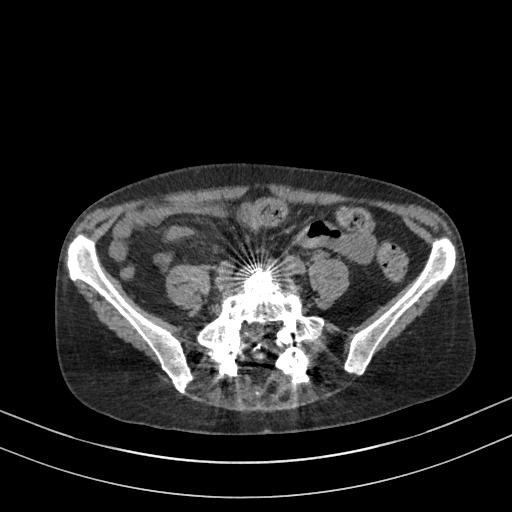
[im 226/243  soft-tissue]
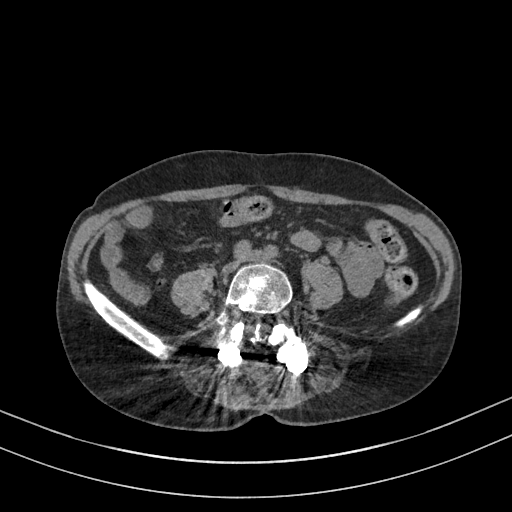

[Series 5: pelvis bone cor · coronal · 0.48mm/px · 3 of 86 slices shown]
[im 29/86  soft-tissue]
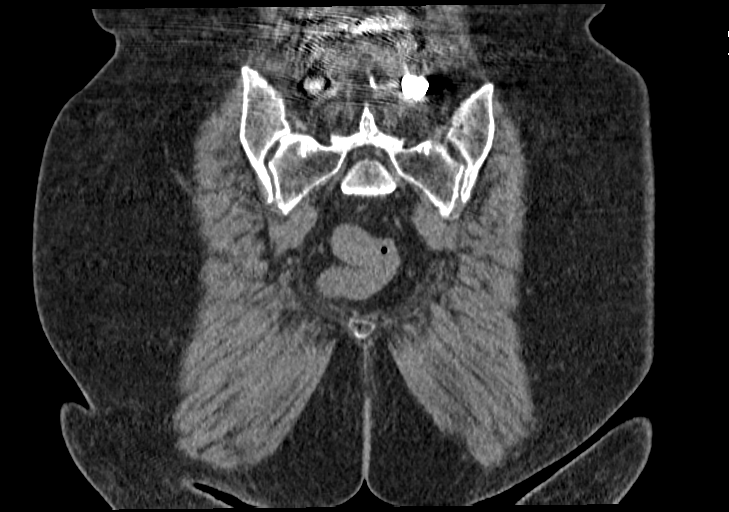
[im 38/86  soft-tissue]
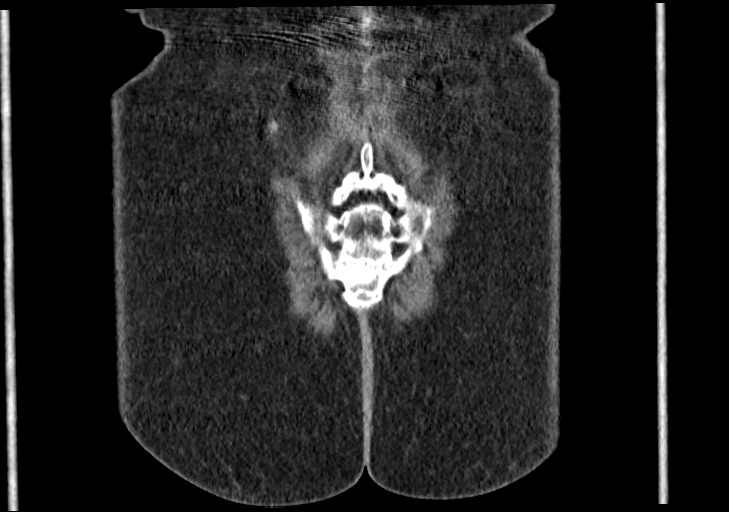
[im 48/86  soft-tissue]
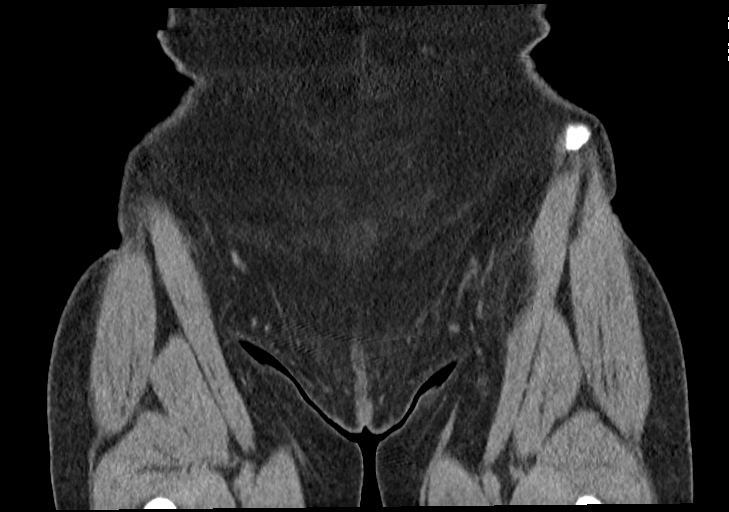

[16 of 46 positions shown; findings below may reference images not displayed]

FINDINGS: There are postoperative changes at the lower lumbar spine 
laminectomies and posterior fusion. There is minimal degenerative changes in 
both SI joints, left more than the right. Both femoral heads and neck are 
intact. There is no signs of insufficiency or stress fracture. No joint space 
narrowing, erosion or chondrocalcinosis.
IMPRESSION: Minimal degenerative arthritis in both SI joints. Both hips appear 
radiographically normal with no evidence of significant degenerative joint 
disease. 
RADIATION DOSE REDUCTION: All CT scans are performed using radiation dose 
reduction techniques, when applicable.  Technical factors are evaluated and 
adjusted to ensure appropriate moderation of exposure.  Automated dose 
management technology is applied to adjust the radiation doses to minimize 
exposure while achieving diagnostic quality images.

## 2019-03-19 IMAGING — MR MRI PELVIS WITHOUT CONTRAST
4 of 7 series · 10 of 48 positions shown · IV contrast (gadolinium)
Comparison: 07/15/2018 CT pelvis

MRI PELVIS WITHOUT CONTRAST, 03/19/2019 [DATE]: 
CLINICAL INDICATION: Sacroiliitis/probably osteoarthritis/hyperparathyroidism. 
Right-sided radiculopathy.
TECHNIQUE: Multiplanar, multiecho position MR images of the pelvis were 
performed without intravenous gadolinium enhancement.

[Series 101: survey · axial · 15.0mm · 1.76mm/px · z∈[-50,+224]mm · 3 of 17 slices shown]
[im 1/17]
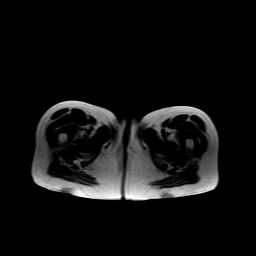
[im 9/17]
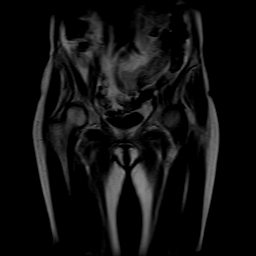
[im 17/17]
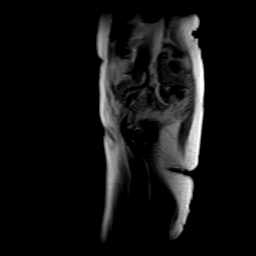

[Series 201: t1_(person_name) · axial · 6.0mm · 0.36mm/px · z∈[-17,+111]mm · 3 of 25 slices shown]
[im 5/25]
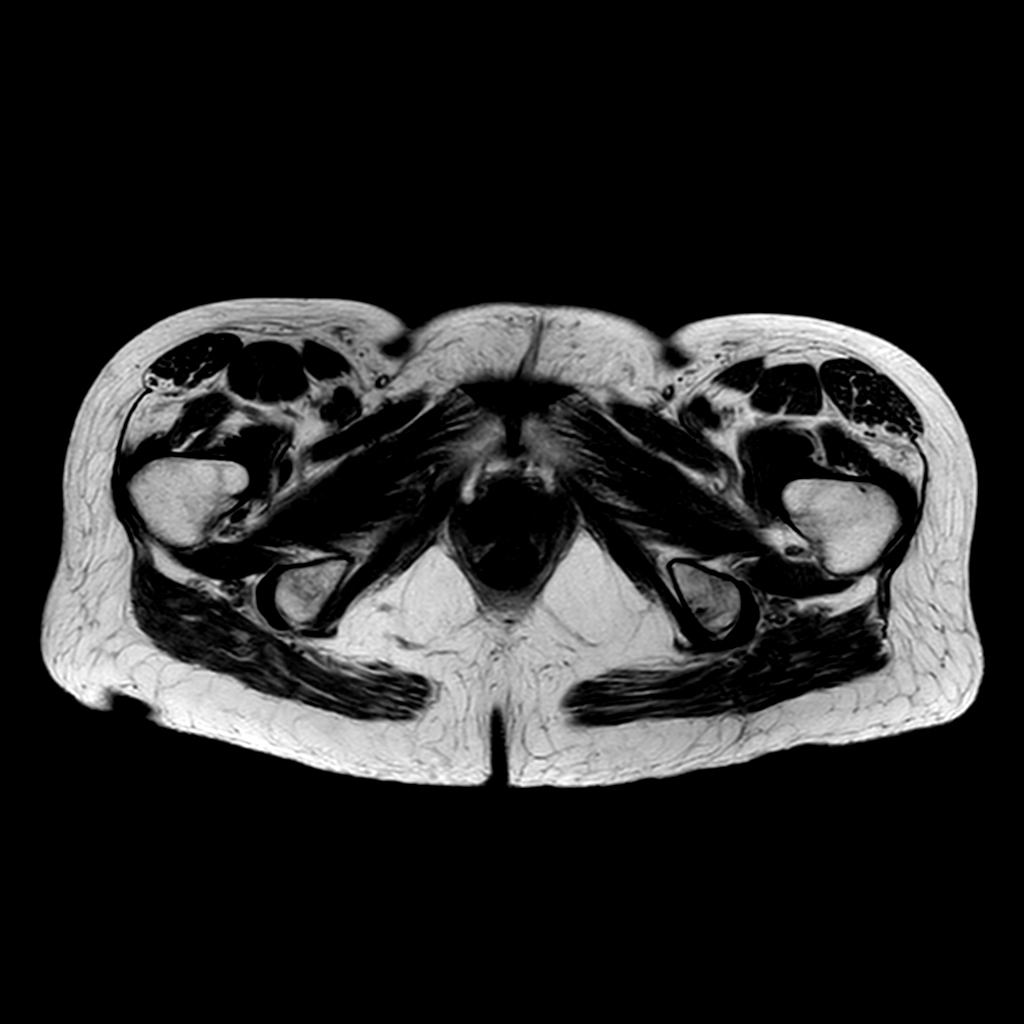
[im 13/25]
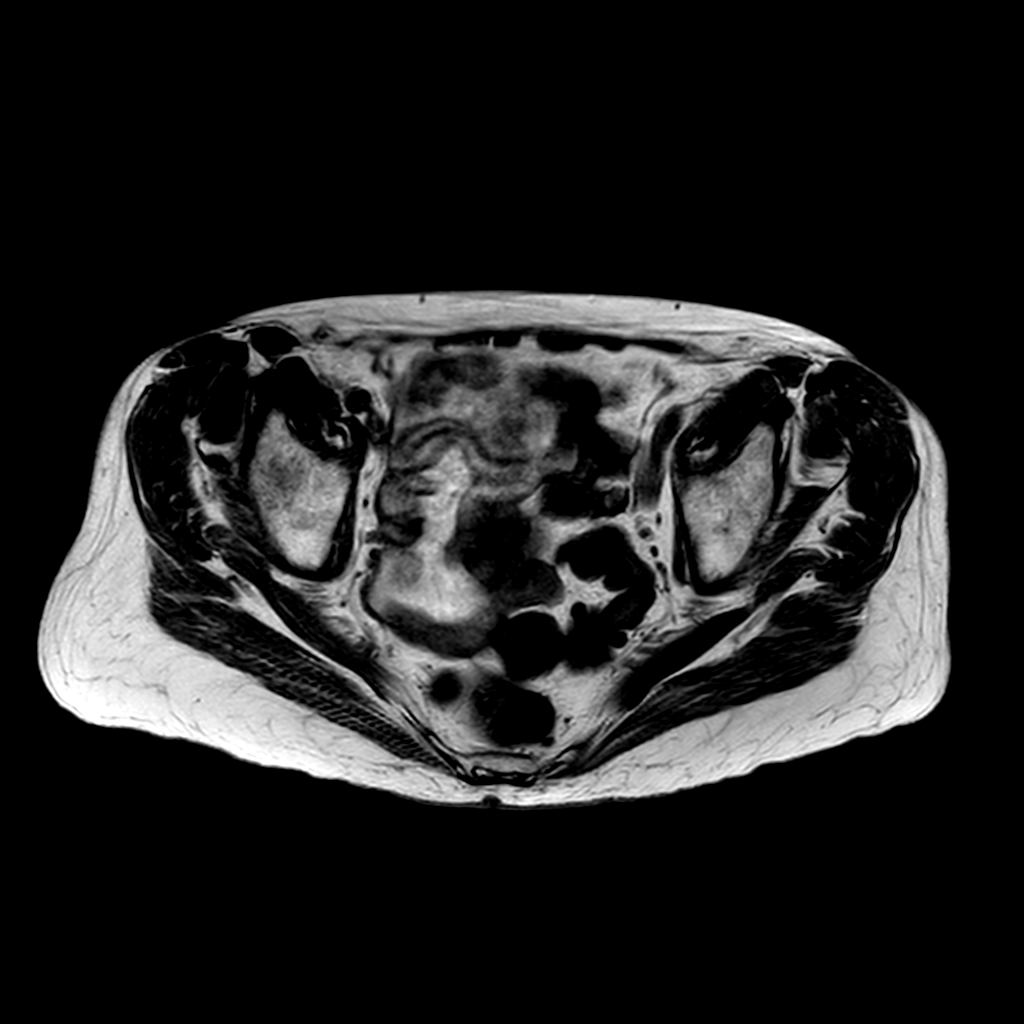
[im 21/25]
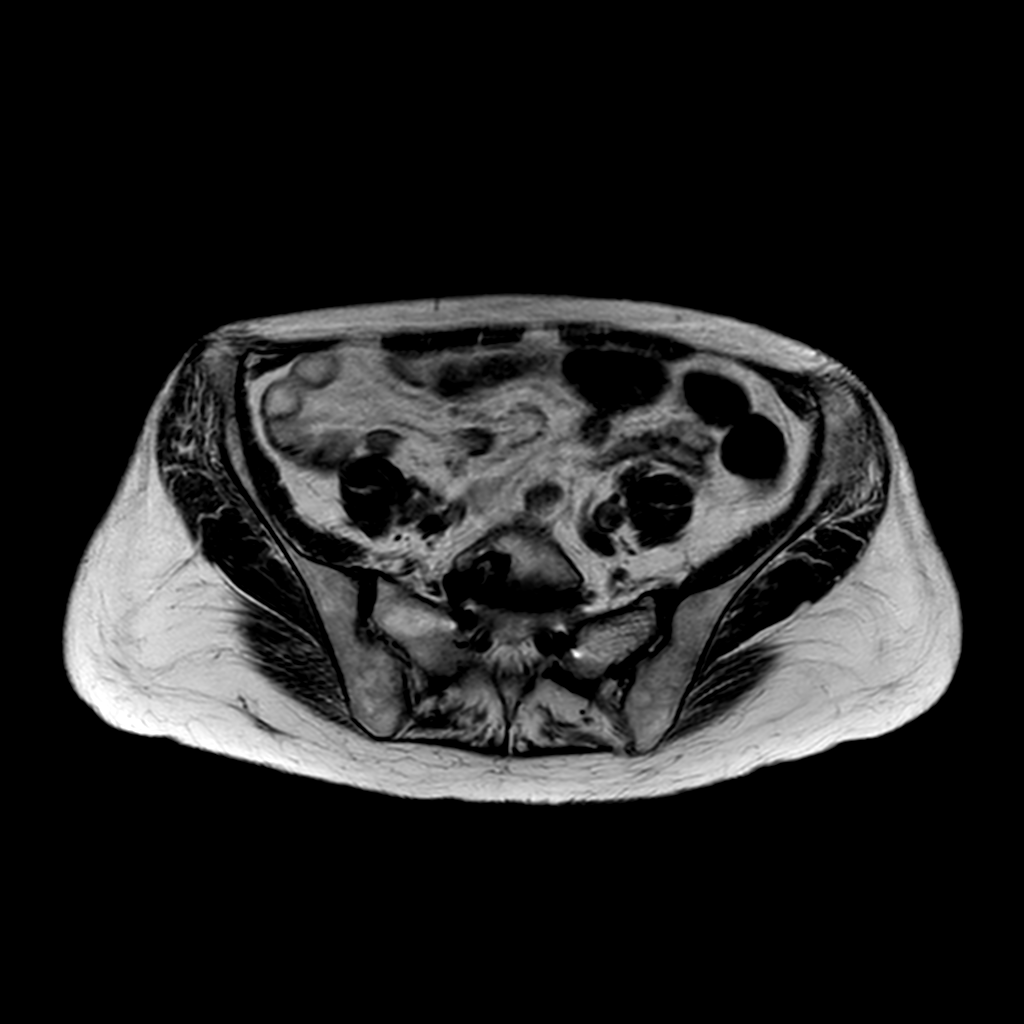

[Series 301: (person_name)_(person_name)_(person_name) · axial · 6.0mm · 0.53mm/px · z∈[-17,+143]mm · 3 of 25 slices shown]
[im 5/25]
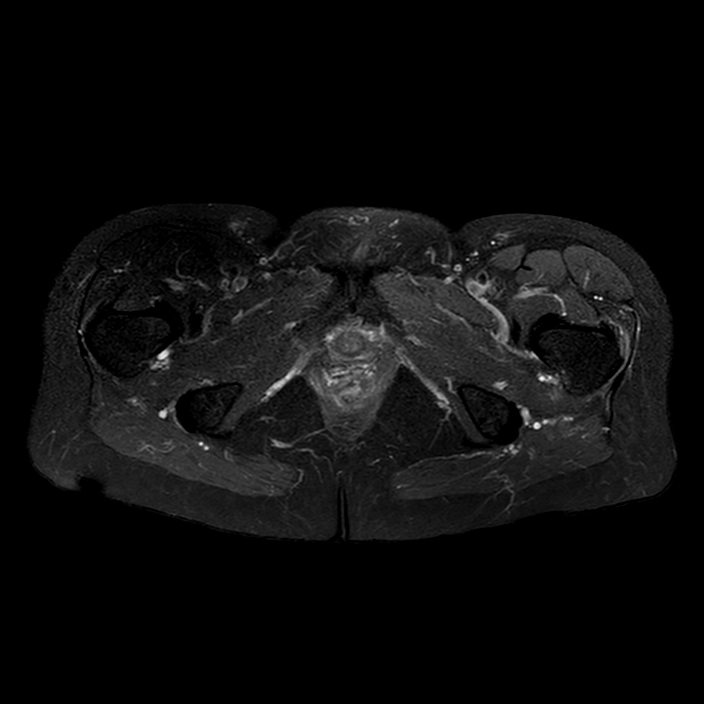
[im 15/25]
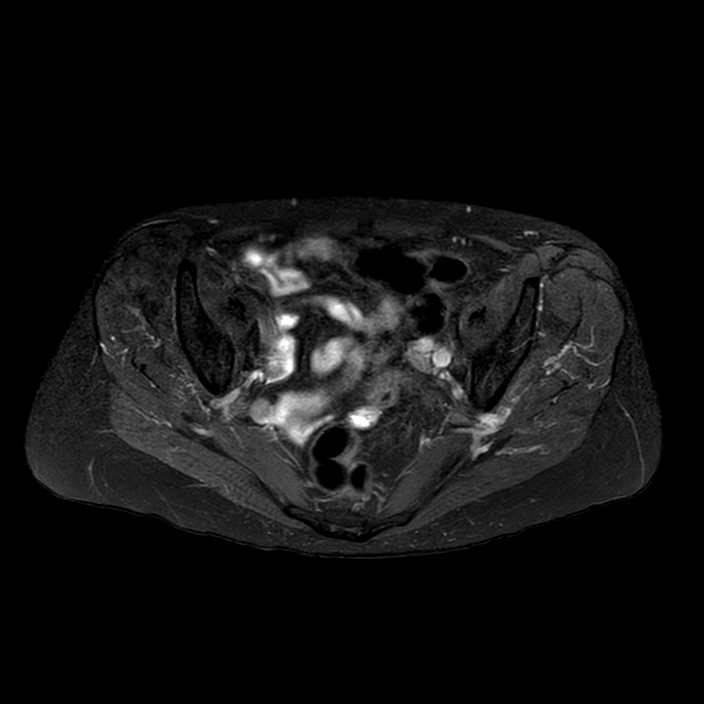
[im 25/25]
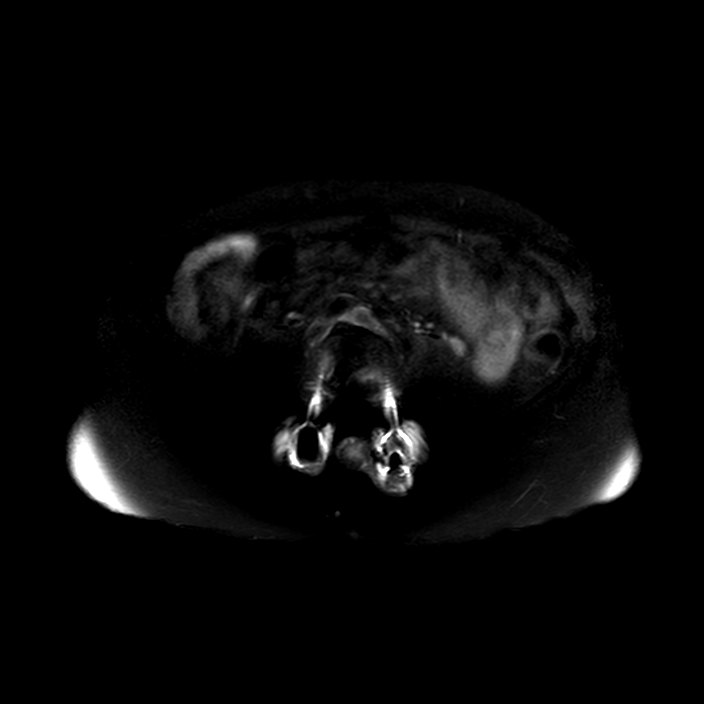

[Series 401: t1_cor · coronal · 5.0mm · 0.64mm/px · 1 of 25 slices shown]
[im 5/25]
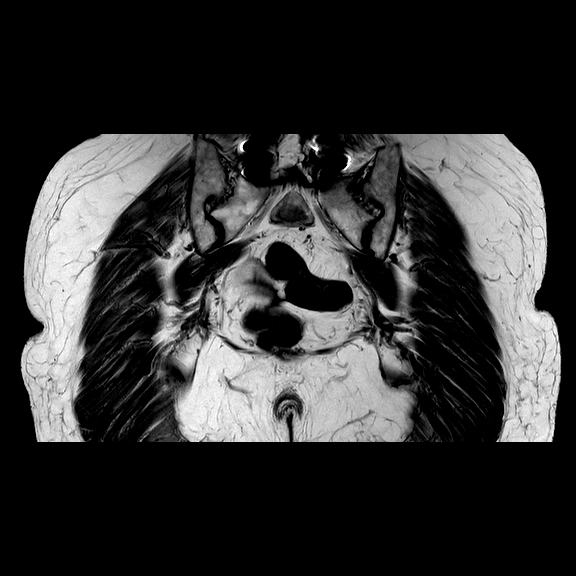

[10 of 48 positions shown; findings below may reference images not displayed]

FINDINGS: SI JOINTS: Mild degenerative change of the SI joints. No evidence of 
sacroiliitis or erosions. 
SACRUM: 4.7 x 2.7 x 2.4 cm focus of fat with mildly T2 hyperintense signal in 
the left superior sacral ala with coarsened trabeculae, consistent with 
hemangioma. 
HIPS: No discrete articular cartilaginous loss of the hips. No labral tear. No 
paralabral cyst. No hip joint effusion. Femoral heads maintain a spherical 
configuration without evidence of avascular necrosis or subarticular collapse. 
impingement. 
PELVIC BONES: No fracture, contusion or marrow replacing lesion. 
PUBIC SYMPHYSIS: Mild degenerative change. 
SPINE: Multilevel lumbosacral pedicle screw fixation (lowest screw in the 
segment below the transitional vertebra), intervertebral disc spacers and 
posterior decompression. 
SOFT TISSUES: Mild tendinosis of the bilateral distal gluteus minimus tendons 
with tendon thickening, intermediate signal and mild peritendinous edema. The 
abductor cuffs are otherwise preserved without high-grade interstitial tear. 
There is trace fluid overlying the greater trochanters without overt 
trochanteric bursitis. The origins of the hamstrings are intact. Included rectus 
sheath is negative. 1.2 cm uterine fundal fibroid. Colonic diverticulosis. No 
free fluid or adenopathy.
IMPRESSION: 1. Mild degenerative change of the SI joints. No evidence of sacroiliitis or 
erosions. 
2. 4.7 x 2.7 x 2.4 cm left superior sacral alar hemangioma. 
3. Mild degenerative change of the pubic symphysis. 
4. Multilevel lumbosacral pedicle screw fixation, intervertebral disc spacers 
and posterior decompression. 
5. Mild gluteal tendinosis and mild peritendinous edema. 
6. 1.2 cm uterine fundal fibroid. 
7. Colonic diverticulosis.

## 2020-05-29 ENCOUNTER — Encounter (INDEPENDENT_AMBULATORY_CARE_PROVIDER_SITE_OTHER): Payer: Self-pay | Admitting: Occupational Health

## 2020-05-29 ENCOUNTER — Other Ambulatory Visit: Payer: Medicare Other | Attending: Occupational Health | Admitting: Occupational Health

## 2020-05-29 VITALS — BP 135/69 | HR 90 | Temp 97.4°F | Resp 16 | Ht 63.0 in | Wt 110.0 lb

## 2020-05-29 DIAGNOSIS — R300 Dysuria: Secondary | ICD-10-CM | POA: Insufficient documentation

## 2020-05-29 DIAGNOSIS — R3915 Urgency of urination: Secondary | ICD-10-CM | POA: Insufficient documentation

## 2020-05-29 LAB — UA, CHEM ONLY POCT
Bilirubin: NEGATIVE
Glucose: NEGATIVE
Ketones: NEGATIVE
Nitrite: NEGATIVE
Protein: NEGATIVE
Specific Gravity: 1.01 (ref 1.002–1.030)
Urobilinogen: 0.2 (ref 0.2–1.0)
pH: 6.5 (ref 5.0–8.0)

## 2020-05-29 MED ORDER — ATORVASTATIN CALCIUM 10 MG OR TABS
ORAL_TABLET | ORAL | Status: AC
Start: 2020-04-04 — End: ?

## 2020-05-29 MED ORDER — PRESERVISION AREDS 2 PO CAPS
ORAL_CAPSULE | ORAL | Status: AC
Start: 2019-08-16 — End: ?

## 2020-05-29 MED ORDER — PHENAZOPYRIDINE HCL 200 MG OR TABS
200.0000 mg | ORAL_TABLET | Freq: Three times a day (TID) | ORAL | 0 refills | Status: DC
Start: 2020-05-29 — End: 2020-05-29

## 2020-05-29 MED ORDER — RESTASIS 0.05 % OP EMUL
OPHTHALMIC | Status: AC
Start: 2018-08-18 — End: ?

## 2020-05-29 MED ORDER — DULOXETINE HCL 30 MG OR CPEP
ORAL_CAPSULE | ORAL | Status: AC
Start: 2020-01-19 — End: ?

## 2020-05-29 MED ORDER — NITROFURANTOIN MONOHYD MACRO 100 MG OR CAPS
100.0000 mg | ORAL_CAPSULE | Freq: Two times a day (BID) | ORAL | 0 refills | Status: AC
Start: 2020-05-29 — End: 2020-06-05

## 2020-05-29 MED ORDER — MELATONIN 10 MG PO CAPS
ORAL_CAPSULE | ORAL | Status: AC
Start: 2018-08-18 — End: ?

## 2020-05-29 MED ORDER — ALENDRONATE SODIUM 35 MG OR TABS
ORAL_TABLET | ORAL | Status: AC
Start: 2020-05-01 — End: ?

## 2020-05-29 NOTE — Progress Notes (Signed)
Alexa Price  MRN: [id number]  DOB: 04/14/1951  PMD: No Pcp, Per Patient    CC: Urinary Urgency (x 1.5 weeks)    Chief Complaint   Patient presents with    Urinary Urgency     x 1.5 weeks       HPI: Alexa Price is a 69 year old female  has no past medical history on file. who presents with c/o urinary urgency, and frequency x 1.5 weeks. She denies any fever, chills, nausea, vomiting, diarrhea, or flank pain. No unusual vaginal dc, odor, itching, or pain.  Patient is visiting from [place]. 06/21/20.  Patient states she had episode of kidney stones earlier this year, this does not feel the same.     Last UTI: earlier this year?  LMP: post menopausal   Sexually active, female partner, monogamous; no concern for STI    Patient masked: yes  Provider masked: yes    PMHx: see hpi  PSHx:  has no past surgical history on file.  Shx:  reports that she has never smoked. She has never used smokeless tobacco.  FamHx: family history is not on file.  Meds: has a current medication list which includes the following prescription(s): alendronate, atorvastatin, restasis, duloxetine, melatonin, preservision areds 2, and nitrofurantoin monohydrate.  ALL: Penicillins and Ciprofloxacin    Review of Systems    EXAM    BP 135/69    Pulse 90    Temp 97.4 F (36.3 C)    Resp 16    Ht 5\' 3"  (1.6 m)    Wt 49.9 kg (110 lb)    SpO2 97%    BMI 19.49 kg/m     Vitals noted     GEN nad a&o nontoxic well appearing  CV rrr no murms, rubs or clicks  LUNGS ctab, no wheezing or crackles   SKIN no acute appearing rash or lesions  BACK no CVAT, no flank pain  ABD NABS, soft, non-tender, no mass, no hepatosplenomegaly  NEURO mae nl mentation, memory, speech and gait    Studies:     Results for orders placed or performed in visit on 05/29/20   UA, Chem Only (POCT)   Result Value Ref Range    Bilirubin Negative Negative    Blood Trace-intact (A) Negative    Color Yellow Yellow    Glucose Negative Negative    Ketones Negative Negative    Leuk Esterase 1+ (A)  Negative    Nitrite Negative Negative    pH 6.5 5.0 - 8.0    Protein Negative Negative    Specific Gravity 1.010 1.002 - 1.030    Urobilinogen 0.2 0.2 - 1.0    Appearance Clear Clear       No results found.      A/P:   Well appearing patient with c/o urinary urgency x  8 days. She denies any fever, chills, nausea, vomiting, diarrhea, flank pain. No unusual vaginal dc, odor, itching, or pain.    UTI v. Pyelonephritis v. BV/Yeast v. STI    UA:  Yellow, clear, trace blood, 1+ leuks, negative nitrates  Urine sent to the lab for culture    Patient has no CVAT, flank pain, fever, chills, nausea, vomiting, diarrhea. Not likely pyelonephritis.  Patient denies any unusual vaginal odor, itching, discharge, pain, so not likely BV or yeast. Vaginal exam deferred due to no vaginal complaints. Patient is sexually active, female partner,  monogamous, states no concern for STD.       Nitrofurantoin  100 mg p.o. b.i.d. for 7 days    Patient assisted in signing up to my chart    Patient instructed to follow up with PCP when she returns to [place].  Strict ER precautions reviewed:  High fever, lethargy, change in level of consciousness. Patient understands and agrees with the plan of care.      ICD-10-CM ICD-9-CM    1. Urinary urgency  R39.15 788.63 nitrofurantoin monohydrate (MACROBID) 100 MG capsule      Urine Culture - See Instructions      DISCONTINUED: phenazopyridine (PYRIDIUM) 200 MG tablet       My supervising physician is Terance Ice, MD

## 2020-05-29 NOTE — Patient Instructions (Signed)
Urinary Tract Infection in Women: Care Instructions      Your Care Instructions    A urinary tract infection, or UTI, is a general term for an infection anywhere between the kidneys and the urethra (where urine comes out). Most UTIs are bladder infections. They often cause pain or burning when you urinate.    UTIs are caused by bacteria and can be cured with antibiotics. Be sure to complete your treatment so that the infection goes away.    Follow-up care is a key part of your treatment and safety. Be sure to make and go to all appointments, and call your doctor if you are having problems. It's also a good idea to know your test results and keep a list of the medicines you take.    How can you care for yourself at home?    . Take your antibiotics as directed. Do not stop taking them just because you feel better. You need to take the full course of antibiotics.  . Drink extra water and other fluids for the next day or two. This may help wash out the bacteria that are causing the infection. (If you have kidney, heart, or liver disease and have to limit fluids, talk with your doctor before you increase your fluid intake.)  . Avoid drinks that are carbonated or have caffeine. They can irritate the bladder.  . Urinate often. Try to empty your bladder each time.  . To relieve pain, take a hot bath or lay a heating pad set on low over your lower belly or genital area. Never go to sleep with a heating pad in place.    To prevent UTIs    . Drink plenty of water each day. This helps you urinate often, which clears bacteria from your system. (If you have kidney, heart, or liver disease and have to limit fluids, talk with your doctor before you increase your fluid intake.)  . Urinate when you need to.  . Urinate right after you have sex.  . Change sanitary pads often.  . Avoid douches, bubble baths, feminine hygiene sprays, and other feminine hygiene products that have deodorants.  . After going to the bathroom, wipe from front  to back.    When should you call for help?    Call your doctor now or seek immediate medical care if:    . Symptoms such as fever, chills, nausea, or vomiting get worse or appear for the first time.  . You have new pain in your back just below your rib cage. This is called flank pain.  . There is new blood or pus in your urine.  . You have any problems with your antibiotic medicine.    Watch closely for changes in your health, and be sure to contact your doctor if:    . You are not getting better after taking an antibiotic for 2 days.  . Your symptoms go away but then come back.    Care instructions adapted under license by [employer]. This care instruction is for use with your licensed healthcare professional. If you have questions about a medical condition or this instruction, always ask your healthcare professional. [employer] disclaims any warranty or liability for your use of this information.

## 2020-05-31 ENCOUNTER — Encounter (INDEPENDENT_AMBULATORY_CARE_PROVIDER_SITE_OTHER): Payer: Self-pay | Admitting: Occupational Health

## 2020-05-31 LAB — URINE CULTURE

## 2020-09-07 IMAGING — MR MRI LUMBAR SPINE WITHOUT CONTRAST
4 of 8 series · 18 of 48 positions shown · IV contrast (gadolinium)
Comparison: Lumbar MRI August 25, 2017

MRI LUMBAR SPINE WITHOUT CONTRAST, 09/07/2020 [DATE]: 
CLINICAL INDICATION: Radiculopathy
TECHNIQUE: Sagittal T1, Sagittal T2, Sagittal STIR, Axial T1 and Axial T2 MR 
images of the lumbar spine were performed without intravenous gadolinium 
enhancement.

[Series 101: survey · axial · 10.0mm · 1.39mm/px · z∈[-15,+199]mm · 2 of 9 slices shown]
[im 1/9]
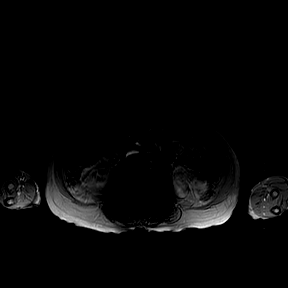
[im 9/9]
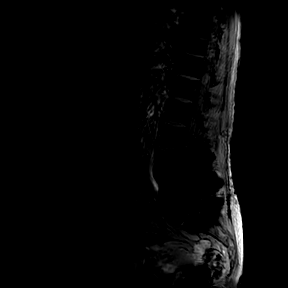

[Series 201: t2w_cor-surv · coronal · 6.0mm · 0.50mm/px · 1 of 5 slices shown]
[im 1/5]
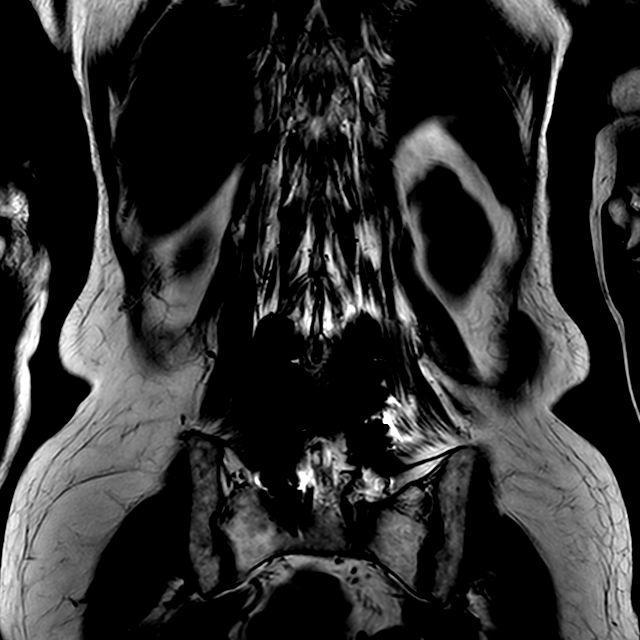

[Series 301: (person_name)1(person_name)_(person_name) · sagittal · 4.0mm · 0.41mm/px · 4 of 17 slices shown]
[im 1/17]
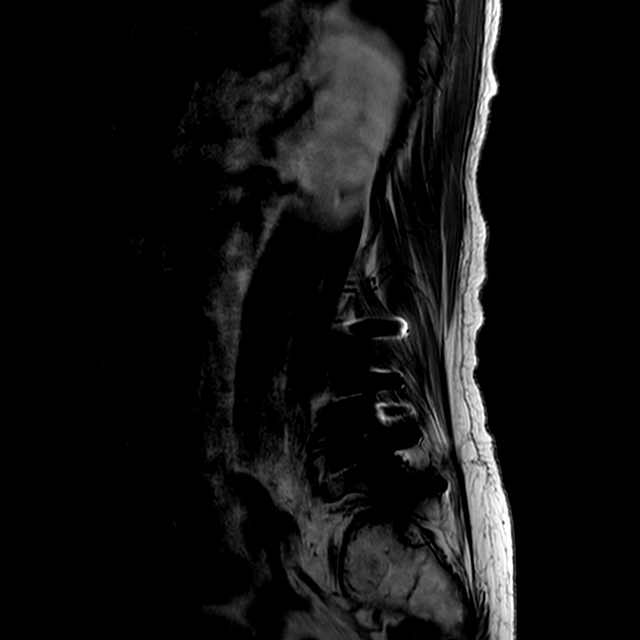
[im 5/17]
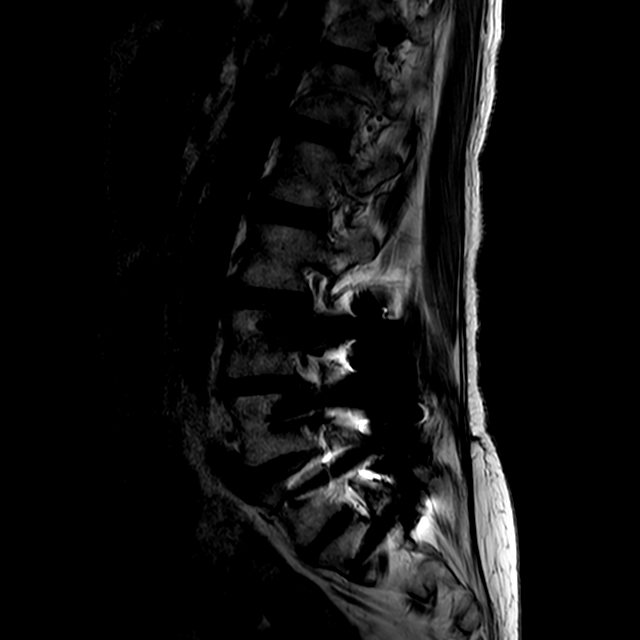
[im 9/17]
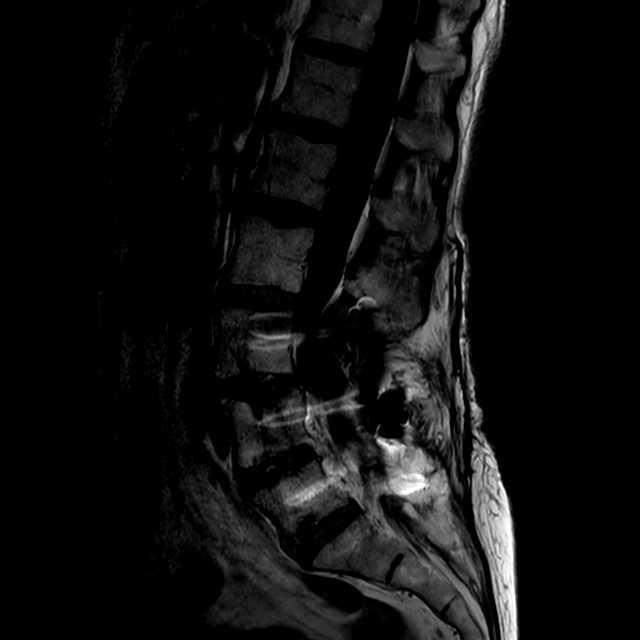
[im 17/17]
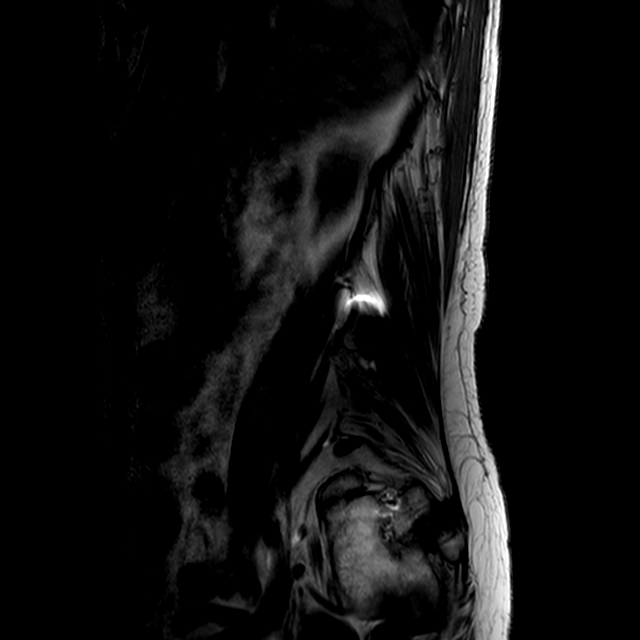

[Series 701: T1 · oblique · 4.0mm · 0.38mm/px · 11 of 42 slices shown]
[im 1/42]
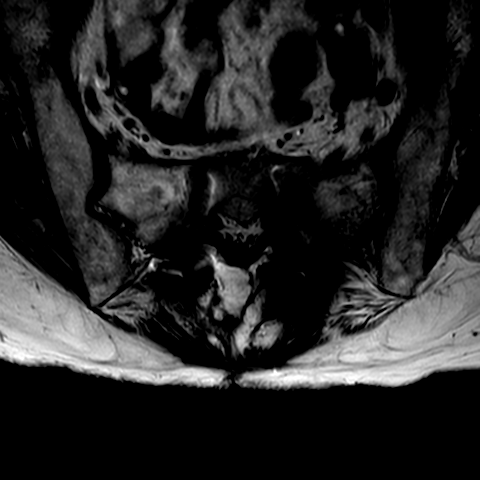
[im 5/42]
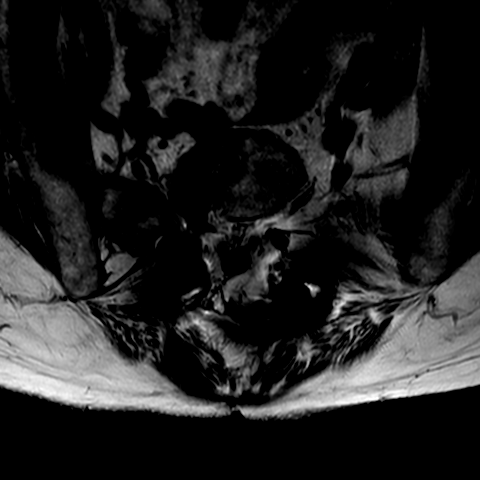
[im 9/42]
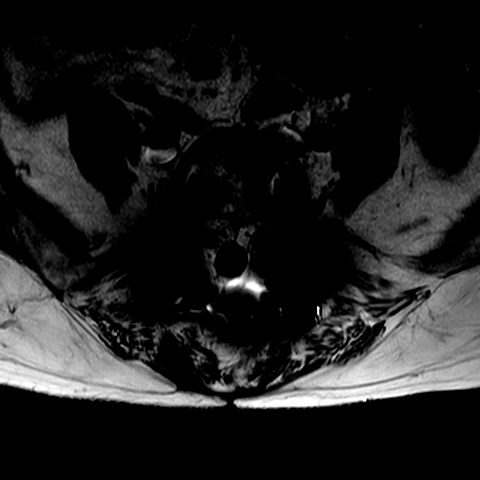
[im 13/42]
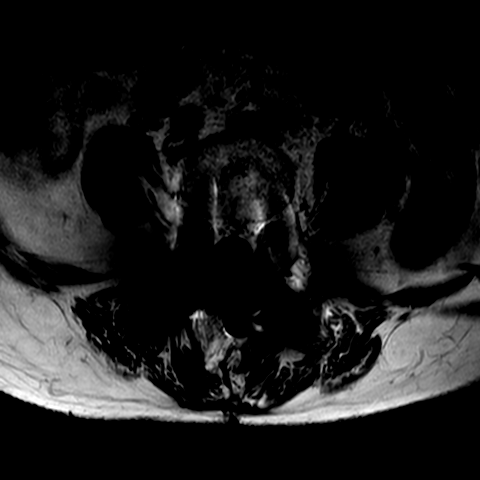
[im 17/42]
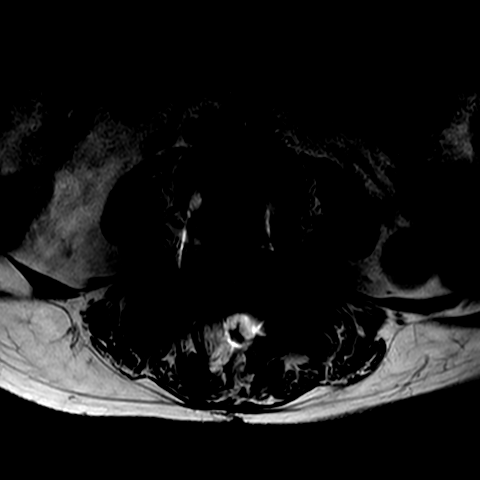
[im 21/42]
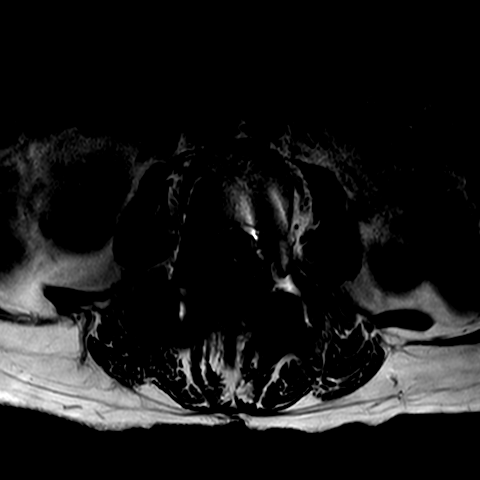
[im 25/42]
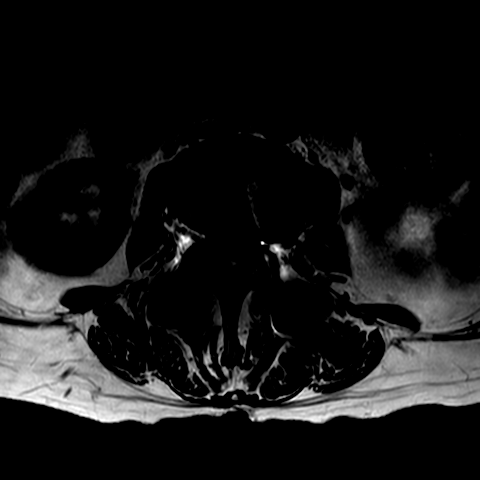
[im 29/42]
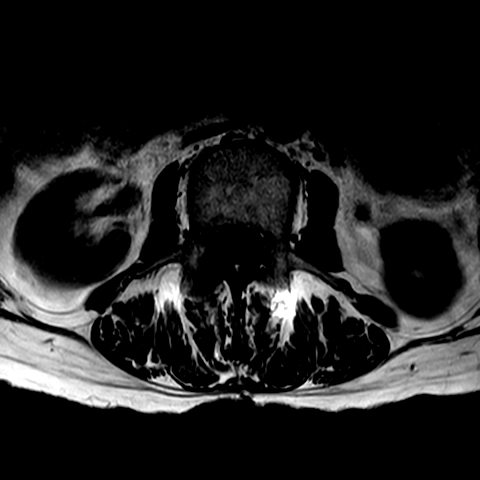
[im 33/42]
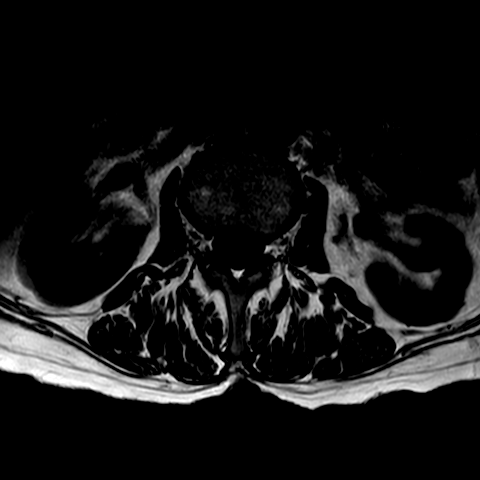
[im 37/42]
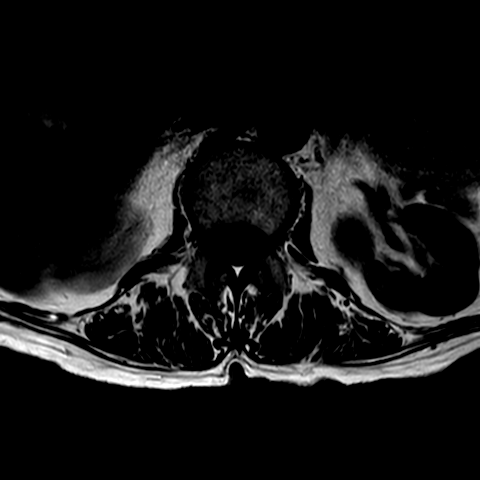
[im 42/42]
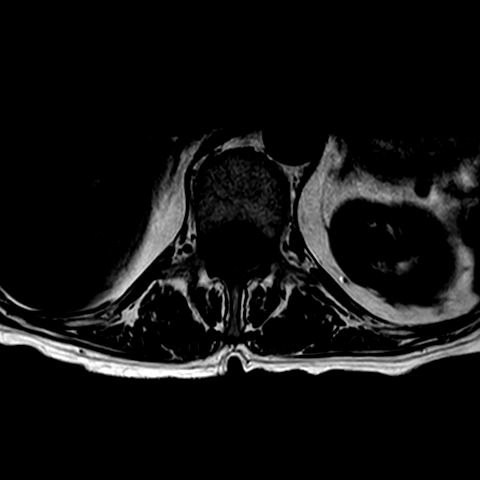

[18 of 48 positions shown; findings below may reference images not displayed]

FINDINGS: Lumbar vertebral heights are intact. There is no evidence for 
fracture. Patient is status post L3-S1 pedicle screw fusion. There are interbody 
disc cages at L3-4 and L4-5. There is susceptibility artifact limiting detail. 
There has been posterior decompression at these levels. There is arachnoiditis. 
There is a loculated intradural cyst opposite L3 deforming the cauda equina 
which are displaced to the right. The cyst extends vertically 3.5 cm, AP 
dimension 15 mm on axial T2 image 12. This is likely postinflammatory 
loculation. 
At L2-3 there is moderate facet and ligamentous hypertrophy and mild disc bulge 
contributing to mild canal stenosis. There is mild left foraminal narrowing, 
right foramen open. 
At L1-2 the canal and foramina are open. 
At T12-L1 the canal and foramina are open. There is no evidence for fracture or 
spinal malignancy. The conus is normal.  Visualized sacrum is intact. 
No findings to indicate spinal infection or malignancy.
IMPRESSION: Stable appearance of L3-S1 fusion hardware. 
Arachnoiditis, not significantly different from the prior study. 
Mild canal stenosis at L2-3 shows mild progression compared to the prior study.

## 2021-01-10 IMAGING — CT CT PELVIS WITHOUT CONTRAST
3 of 4 series · 12 of 35 positions shown, 14 images · non-contrast
Comparison: 04/05/2020 MRI pelvis and 07/07/2018 CT pelvis

FINAL Diagnostic Imaging Report 
________________________________________________________________________________________________ 
CT PELVIS WITHOUT CONTRAST, 01/10/2021 [DATE]: 
CLINICAL INDICATION: Postlaminectomy. Right SI joint pain. 
A search for DICOM formatted images was conducted for prior CT imaging studies 
completed at a non-affiliated media free facility.
TECHNIQUE: The pelvis was scanned from lower abdomen though the pubic rami 
without contrast on a high-resolution CT scanner using dose reduction 
techniques.  Routine MPR reconstructions were performed.

[Series 14: pelvis/hip 1.5 i31s 2 · axial · 0.77mm/px · z∈[-495,-315]mm · 4 of 270 slices shown, 5 images]
[im 45/270  soft-tissue]
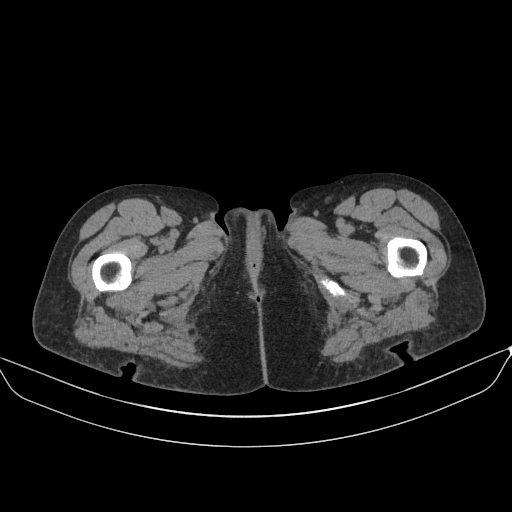
[im 45/270  bone]
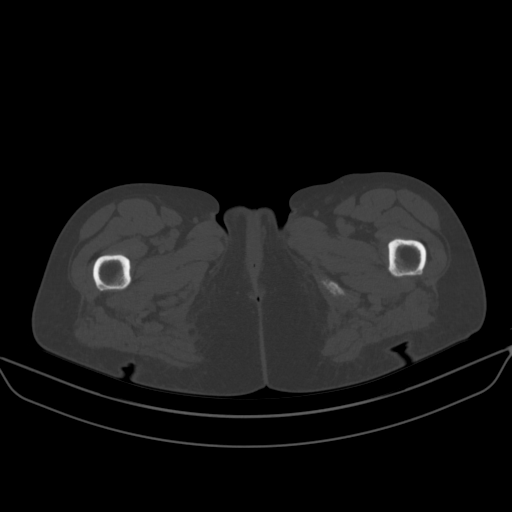
[im 90/270  bone]
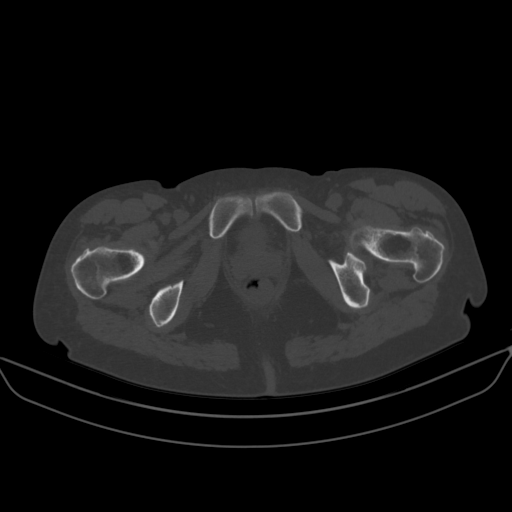
[im 180/270  bone]
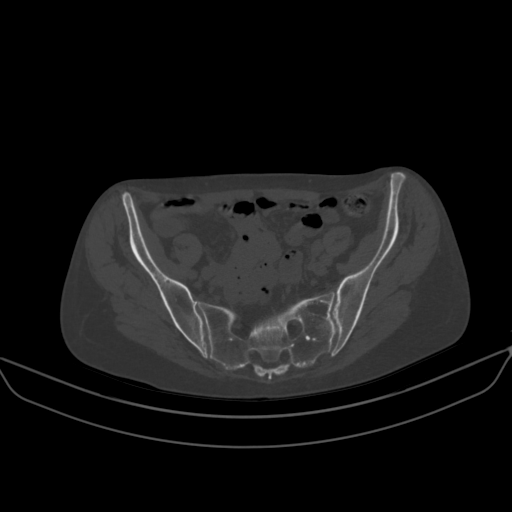
[im 225/270  bone]
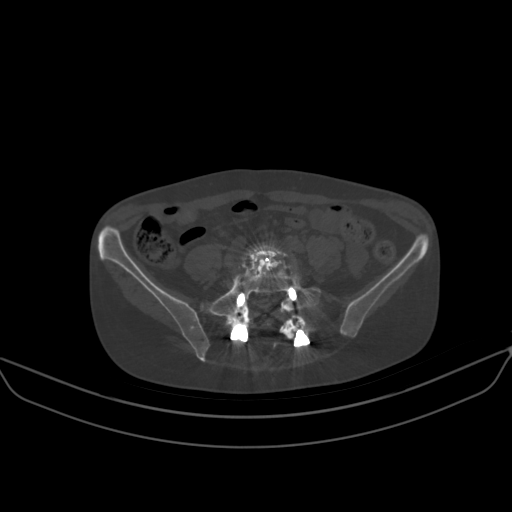

[Series 17: bialt bone cor · coronal · 0.54mm/px · 3 of 98 slices shown]
[im 20/98  bone]
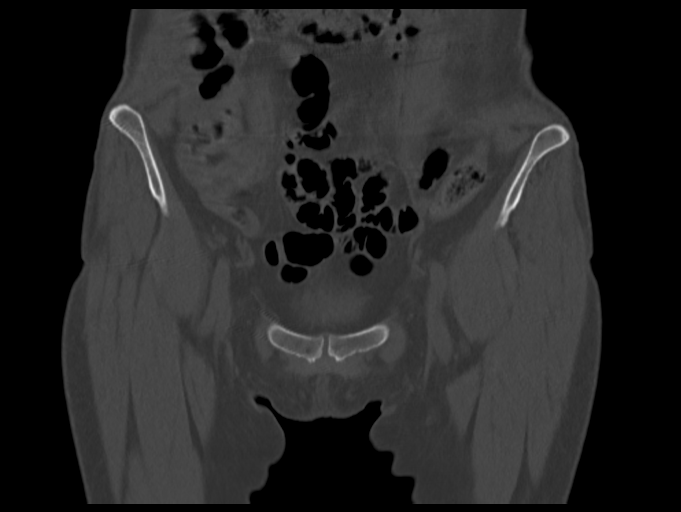
[im 39/98  bone]
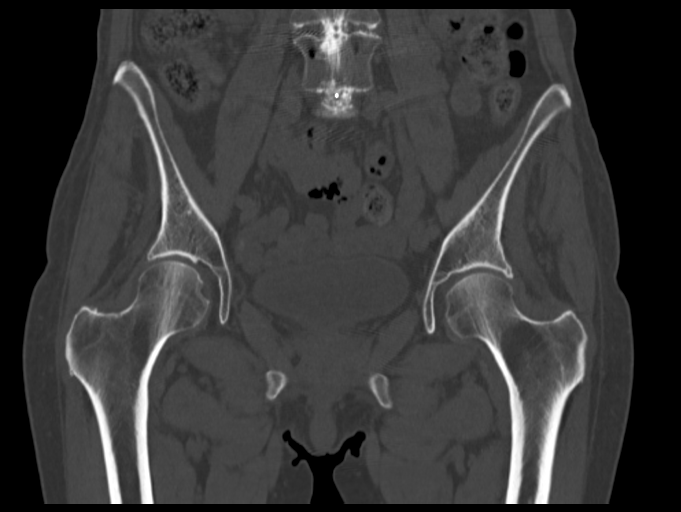
[im 59/98  bone]
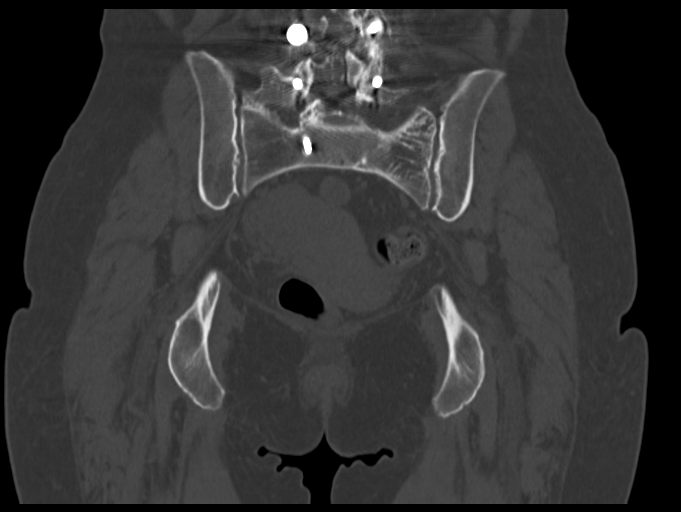

[Series 18: hip bone sag · sagittal · 0.43mm/px · 5 of 190 slices shown, 6 images]
[im 64/190  bone]
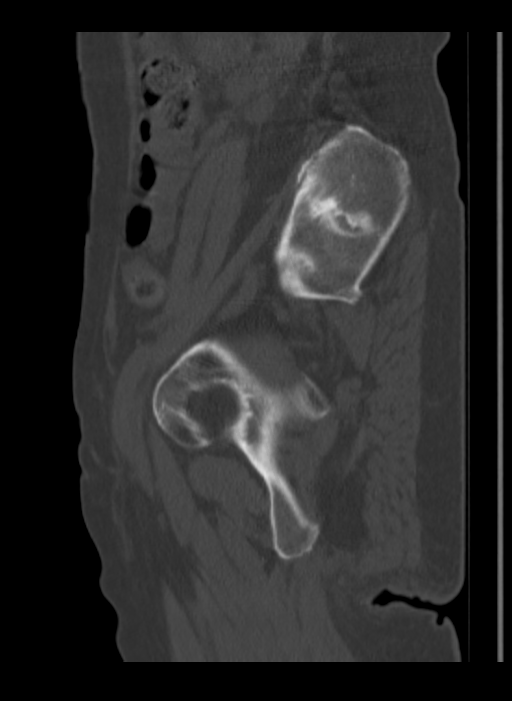
[im 79/190  bone]
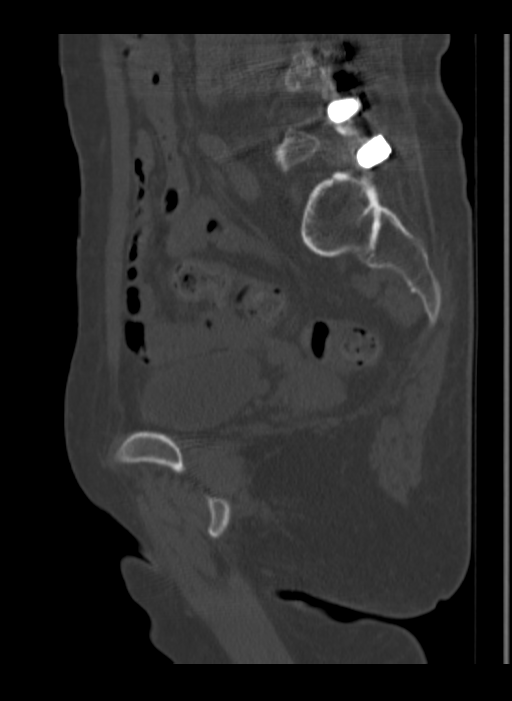
[im 95/190  soft-tissue]
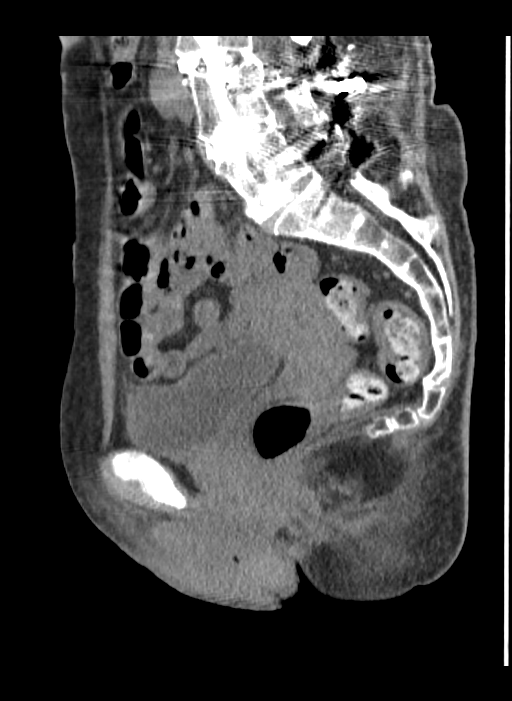
[im 95/190  bone]
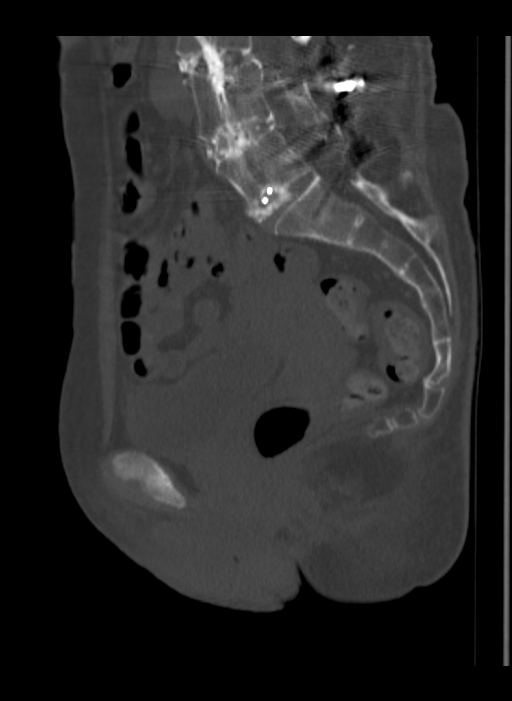
[im 111/190  bone]
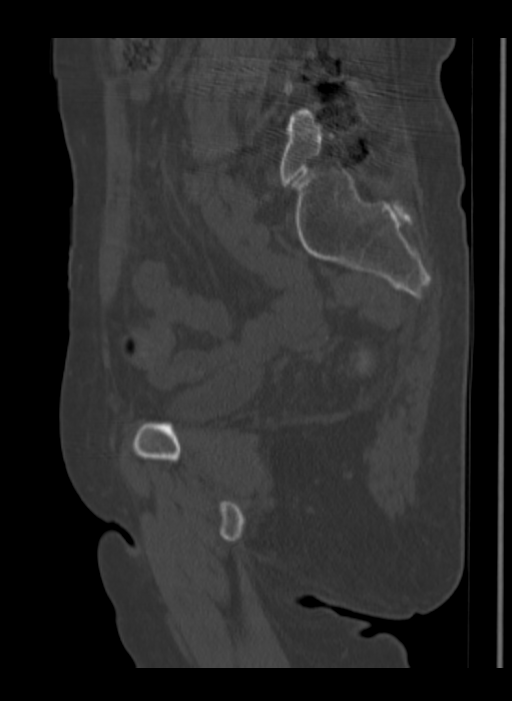
[im 127/190  bone]
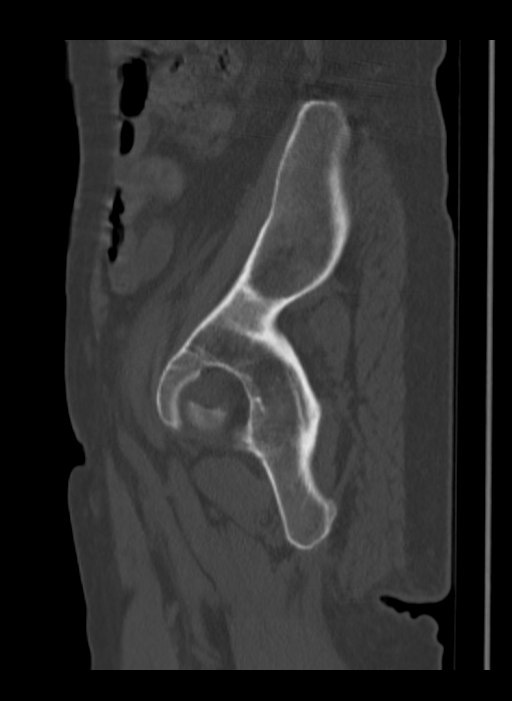

[12 of 35 positions shown; findings below may reference images not displayed]

FINDINGS: SPINE: Lumbosacral transitional segment. Lumbosacral posterior decompression, 
pedicle screw fixation and intervertebral disc spacers.  
SI JOINTS: Mild degenerative change. 
HIPS: Hip joints are preserved. Both femoral heads maintain a spherical 
configuration without evidence of avascular necrosis or subarticular collapse. 
No abnormal morphology of the proximal femurs or acetabulum to predispose to 
impingement. 
BONES: No fracture or marrow replacing lesion. Bilateral posteromedial iliac 
donor defects. Stable left sacral alar lucency with adjacent trabecular 
coarsening, likely hemangioma. Osteopenia. 
PUBIC SYMPHYSIS: Mild degenerative change. 
BOWEL: Colonic diverticulosis. Normal wall thickness, no obstruction. 
BLADDER: Normal configuration and wall thickness. 
PELVIC ORGANS: Normal. 
PERITONEAL CAVITY: No fluid collection. 
LYMPH NODES: No adenopathy. 
ILIOFEMORAL VESSELS: No aneurysm.
IMPRESSION: 1.  Lumbosacral posterior decompression, pedicle screw fixation, intervertebral 
disc spacers and transitional segment. Please refer to 01/10/2021 lumbar spine CT 
dictation for multiple findings. 
2.  Mild degenerative change of the SI joints. 
3.  Bilateral posteromedial iliac donor defects.  
4.  Stable left sacral alar lucency with adjacent trabecular coarsening, likely 
hemangioma.  
5.  Osteopenia: DXA may be helpful for further evaluation. 
6.   Colonic diverticulosis. 
RADIATION DOSE REDUCTION: All CT scans are performed using radiation dose 
reduction techniques, when applicable.  Technical factors are evaluated and 
adjusted to ensure appropriate moderation of exposure.  Automated dose 
management technology is applied to adjust the radiation doses to minimize 
exposure while achieving diagnostic quality images.

## 2021-01-10 IMAGING — CT CT LUMBAR SPINE WITHOUT CONTRAST
3 of 5 series · 11 of 34 positions shown, 12 images · non-contrast
Comparison: 

FINAL Diagnostic Imaging Report 
________________________________________________________________________________________________ 
CT LUMBAR SPINE WITHOUT CONTRAST, 01/10/2021 [DATE]: 
CLINICAL INDICATION: Postlaminectomy, patient describes occasional right SI pain 
extending into leg 
A search for DICOM formatted images was conducted for prior CT imaging studies 
completed at a non-affiliated media free facility.
TECHNIQUE: The lumbar spine was scanned from T12 through mid-sacrum without 
contrast on a high-resolution CT scanner using dose reduction techniques. 
Routing MPR reconstructions were performed.

[Series 7: l spine 2.0 b31s · axial · 0.29mm/px · z∈[-308,-222]mm · 2 of 131 slices shown, 3 images]
[im 44/131  soft-tissue]
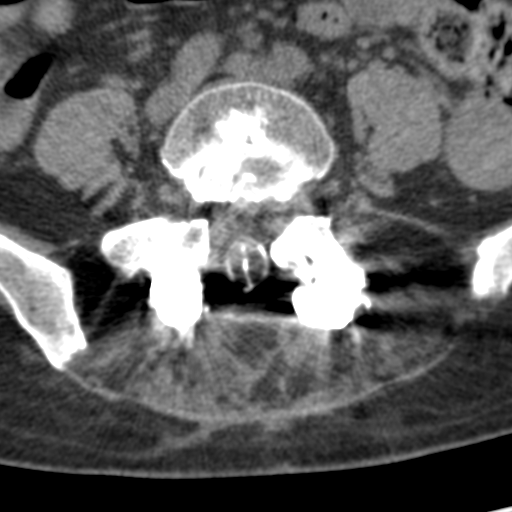
[im 44/131  bone]
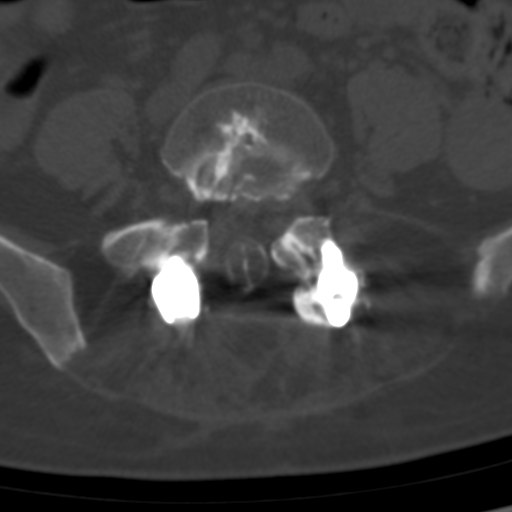
[im 87/131  bone]
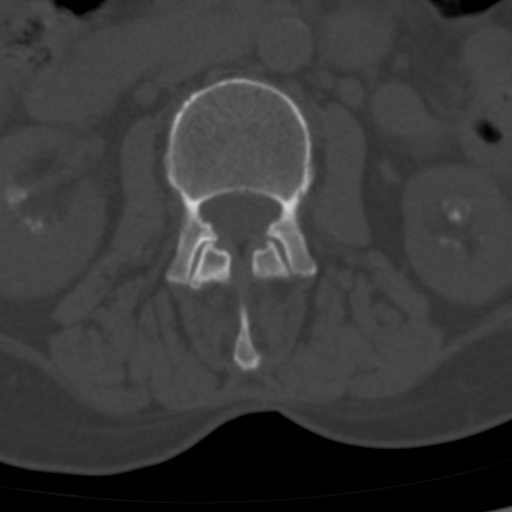

[Series 9: l spine coronal · coronal · 0.30mm/px · 3 of 74 slices shown]
[im 15/74  bone]
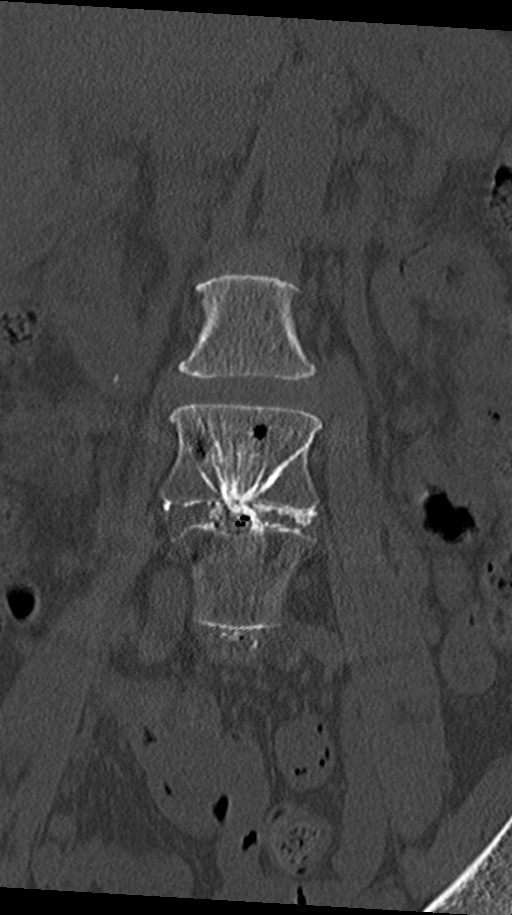
[im 30/74  bone]
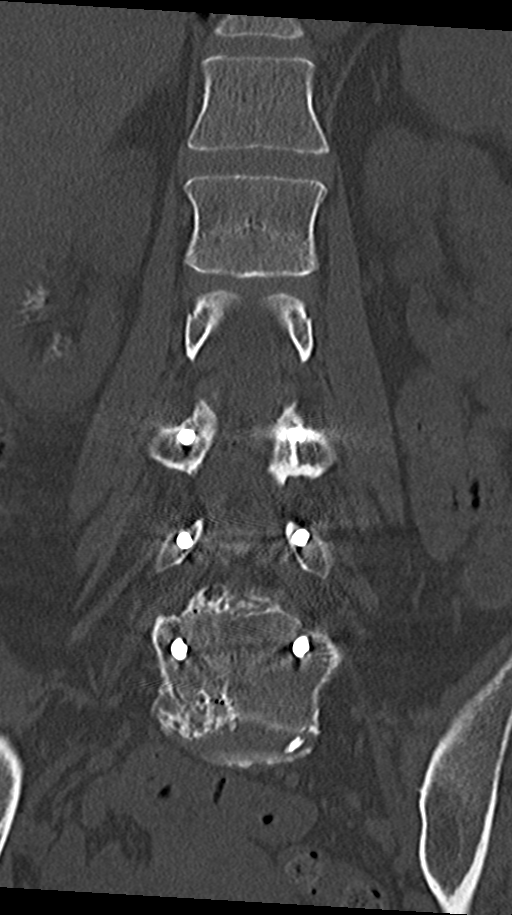
[im 44/74  bone]
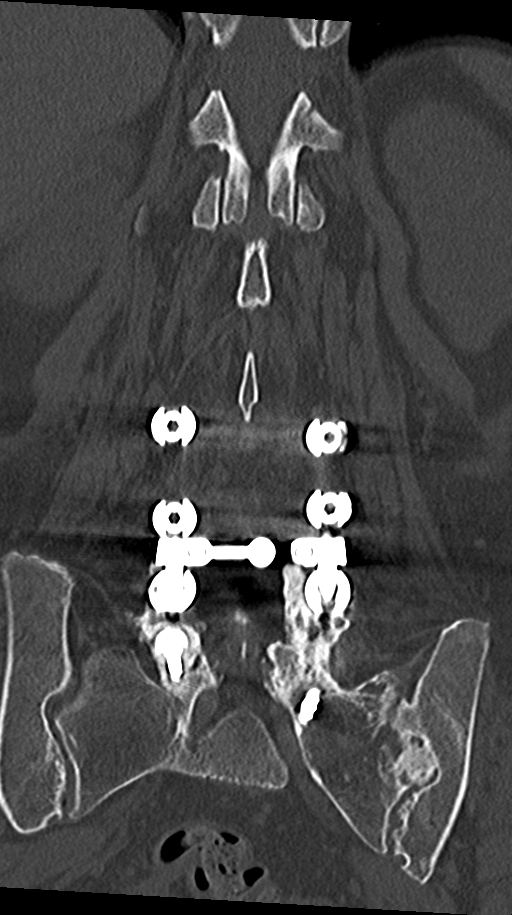

[Series 10: l spine sag · sagittal · 0.30mm/px · 6 of 76 slices shown]
[im 7/76  soft-tissue]
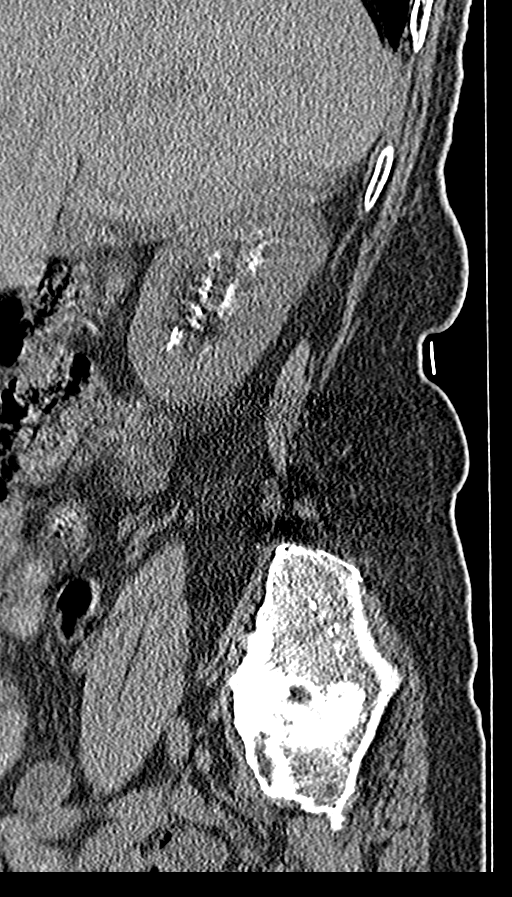
[im 13/76  bone]
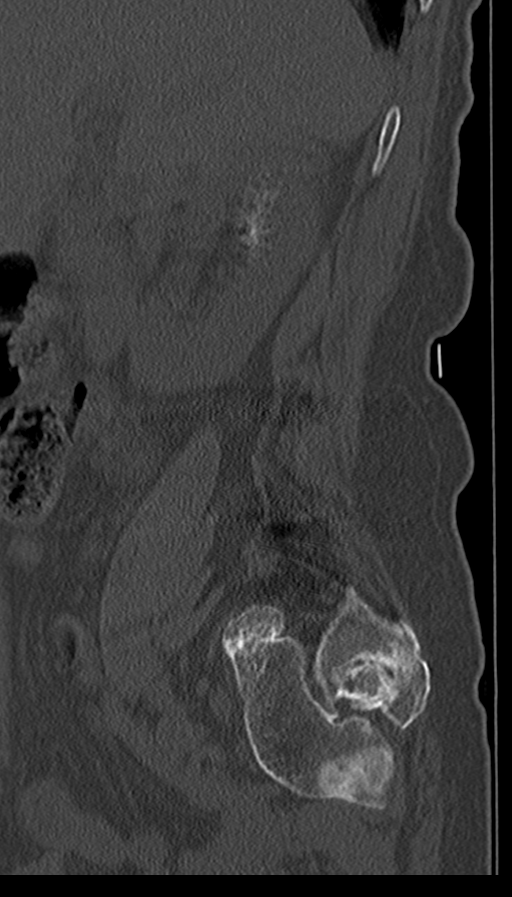
[im 26/76  bone]
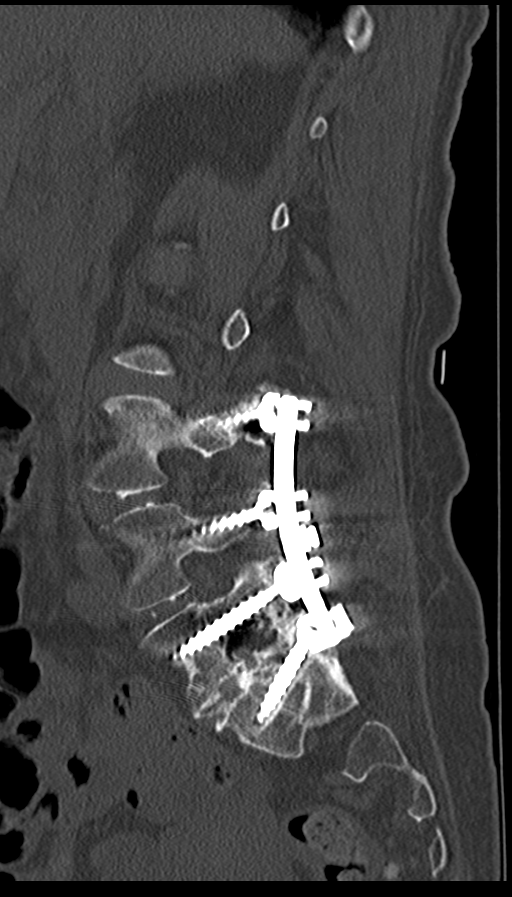
[im 38/76  bone]
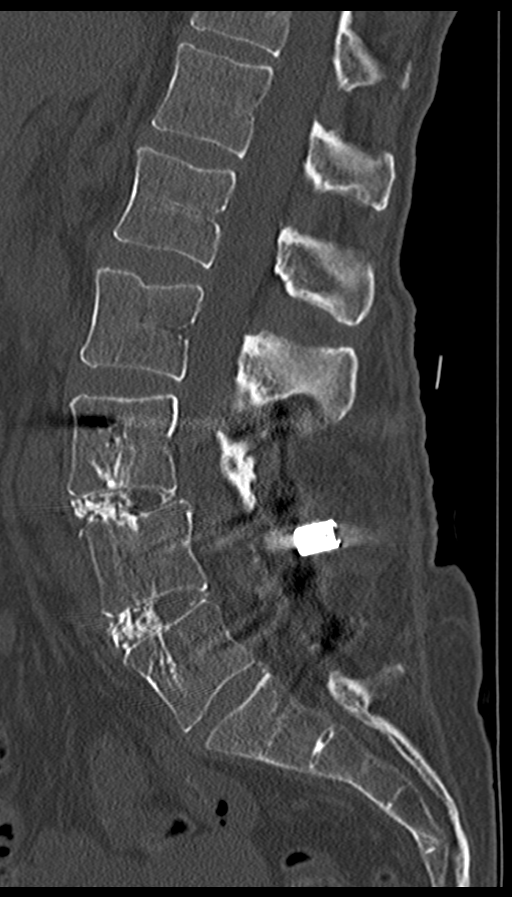
[im 51/76  bone]
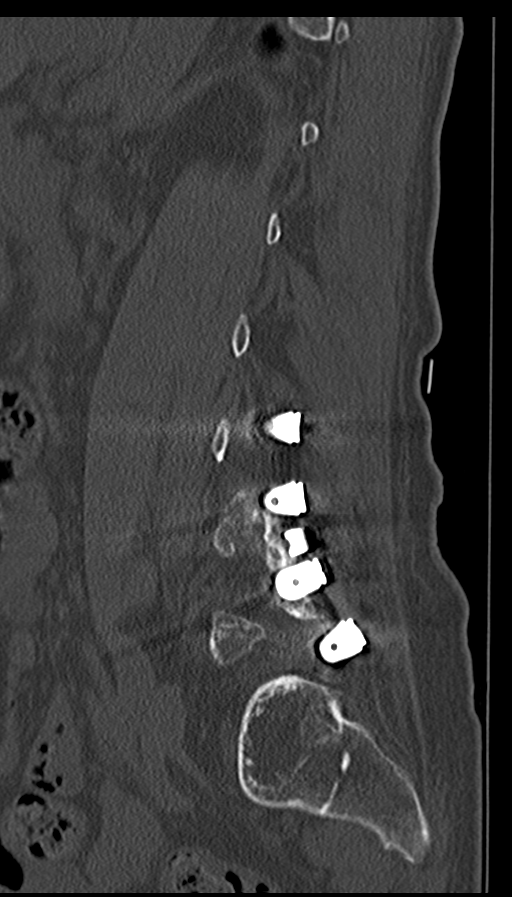
[im 63/76  bone]
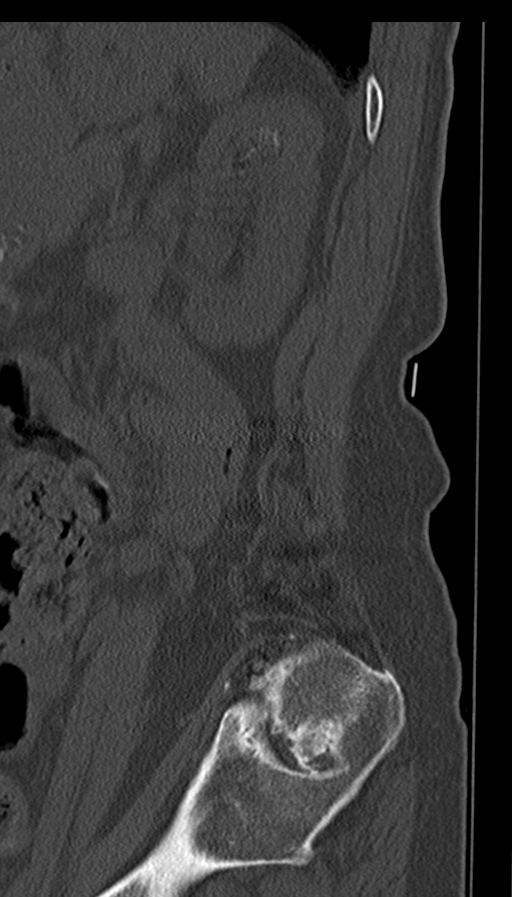

[11 of 34 positions shown; findings below may reference images not displayed]

FINDINGS: Lumbar vertebral heights are intact. The patient is status post L3-S1 
pedicle screw and interbody disc cage fusion. The right L5 pedicle screw 
slightly violates the medial pedicular cortex, and the left L5 screw also 
minimally encroaches on the medial cortex, without transgressing the neural 
foramina. Other hardware appears appropriately positioned. Posterior element 
fusion bone appears solid. 
At L5-S1 the canal is open. There is mild right foraminal stenosis. 
At L4-5 there has been decompression. There is calcification of the dural sac 
consistent with arachnoiditis. Diastematomyelia cannot be excluded. 
At L3-4 the canal and foramina are open. There has been right laminectomy with 
decompression. 
At L2-3 there is mild canal stenosis, axial image 58 due to ligamentous 
thickening and mild annular bulge. There is mild left foraminal stenosis, 
minimal on the right. 
At L1-2 the canal and foramina are open. 
There are bone harvest sites along the posterior medial iliac wings. There are 
moderate SI joint degenerative changes. No evidence for bone erosion or SI joint 
ankylosis.
IMPRESSION: Postsurgical changes as described. Please see comments above. 
Calcification of the thecal sac at L4-5 likely indicates arachnoiditis. There is 
a sagittally oriented calcific bar traversing thecal sac opposite L4. 
The metal mildly at cannot be excluded. Similar findings on the prior MRI. 
Mild canal stenosis at L2-3. This appears somewhat worse than on the prior MRI, 
reflecting technical differences between examination. 
No evidence for fracture. No bone destruction to suggest infection or 
malignancy. 
Multiple bilateral renal stones, which could indicate renal tubular 
acidosis/medullary sponge kidney. No hydronephrosis identified.

## 2022-01-03 IMAGING — CT CT LUMBAR SPINE WITHOUT CONTRAST
4 series · 11 of 20 positions shown, 12 images · non-contrast
Comparison: CT exam of 01/10/2021. MR exam of 09/07/2020.

________________________________________________________________________________________________ 
CT LUMBAR SPINE WITHOUT CONTRAST, 01/03/2022 [DATE]: 
CLINICAL INDICATION: Evaluate back pain. Surgery in May 2021. Relief for 2 
weeks status post surgery. Progressive pain since surgery with pain radiating to 
right knee and tingling into foot. Evaluate back pain, hardware loosening. 
A search for DICOM formatted images was conducted for prior CT imaging studies 
completed at a non-affiliated media free facility.
TECHNIQUE: The lumbar spine was scanned from T12 through mid-sacrum without 
contrast on a high-resolution CT scanner using dose reduction techniques. 
Routing MPR reconstructions were performed.

[Series 3: axial · axial · 0.32mm/px · z∈[+830,+919]mm · 2 of 268 slices shown]
[im 90/268  bone]
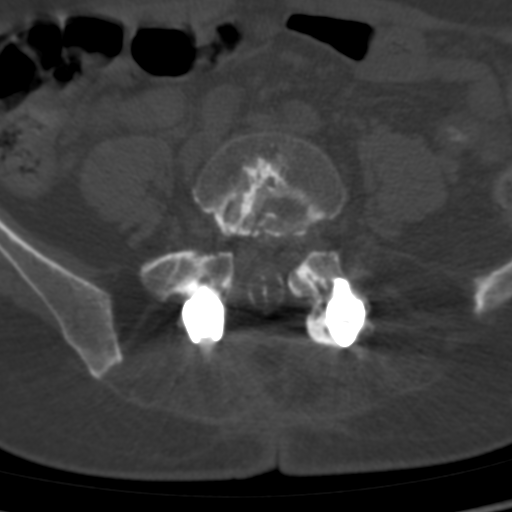
[im 179/268  bone]
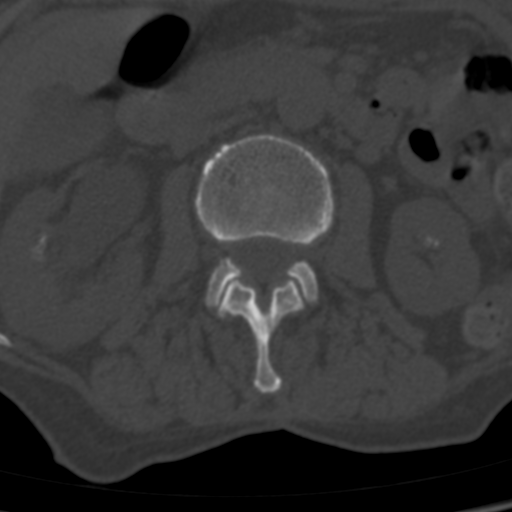

[Series 5: axial (person_name) · axial · 0.32mm/px · z∈[+807,+941]mm · 3 of 268 slices shown]
[im 67/268  bone]
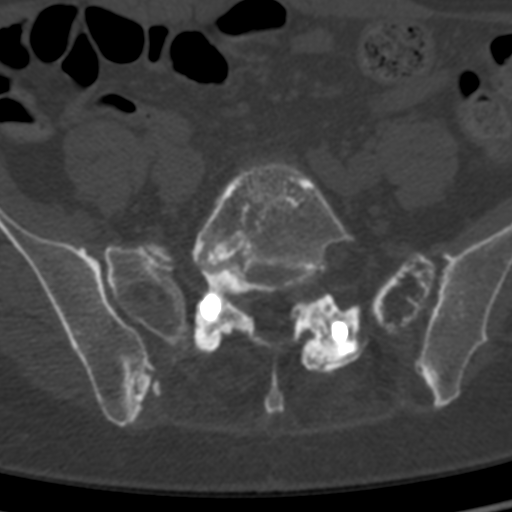
[im 134/268  bone]
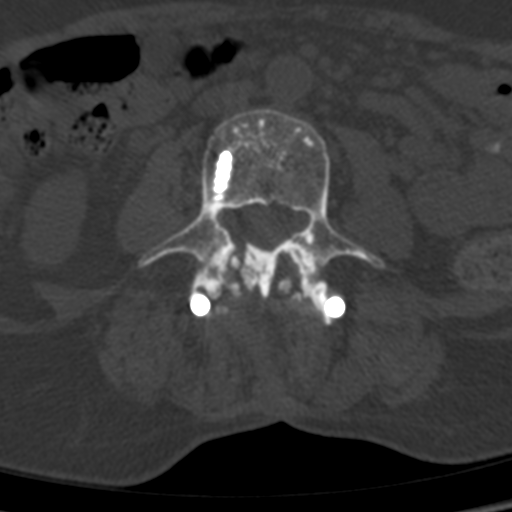
[im 201/268  bone]
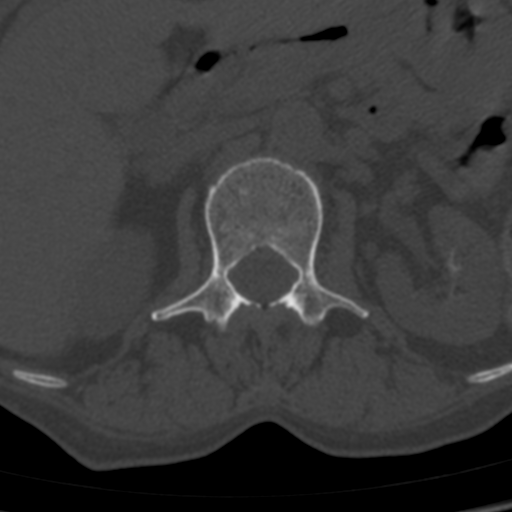

[Series 6: cor · coronal · 0.31mm/px · 3 of 136 slices shown]
[im 28/136  bone]
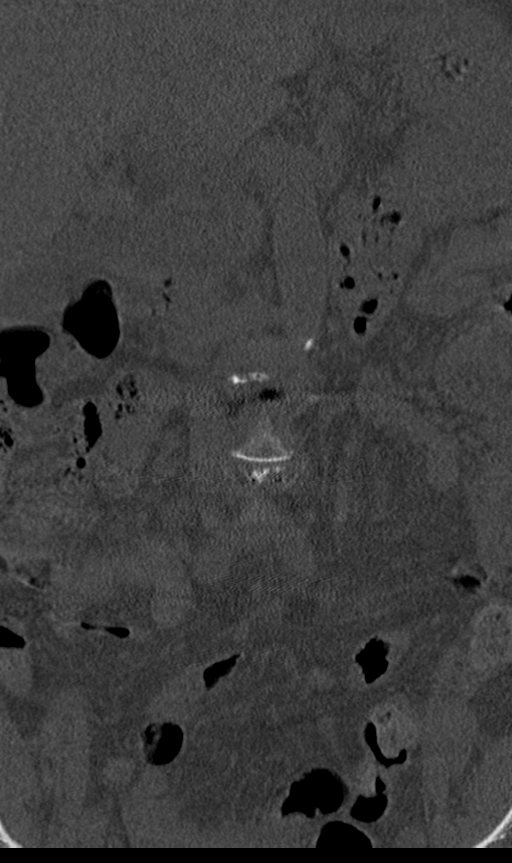
[im 55/136  bone]
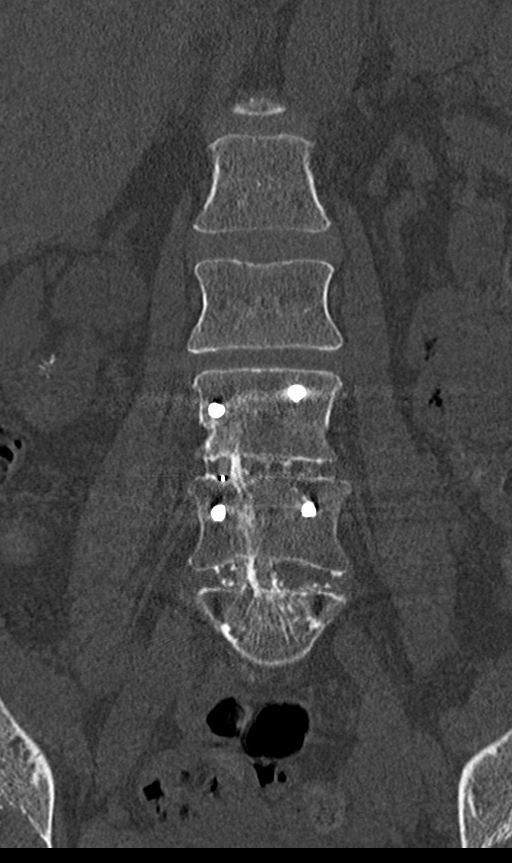
[im 82/136  bone]
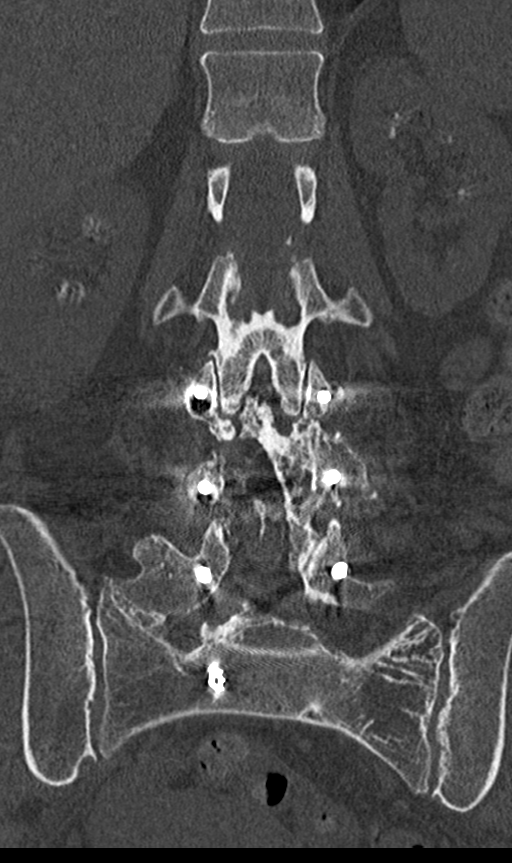

[Series 10: st multi (person_name) · axial · 0.41mm/px · z∈[+780,+959]mm · 3 of 276 slices shown, 4 images]
[im 69/276  soft-tissue]
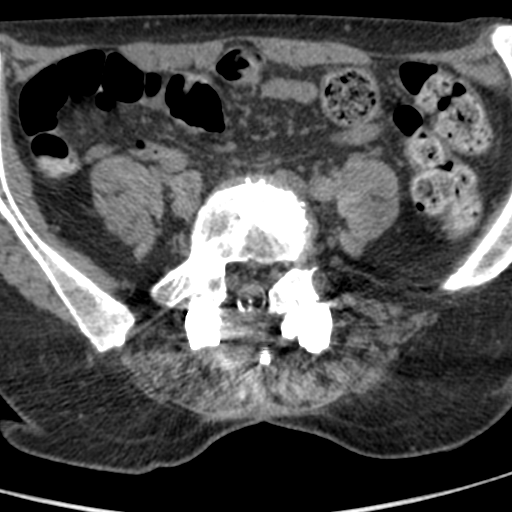
[im 69/276  bone]
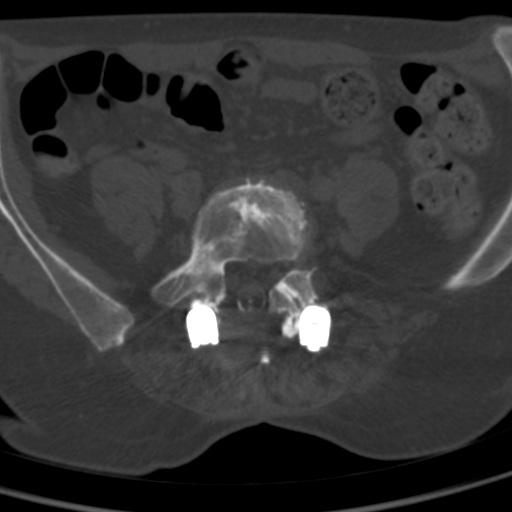
[im 138/276  bone]
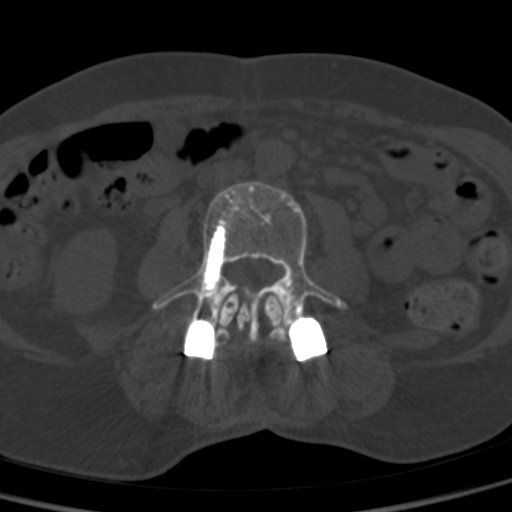
[im 207/276  bone]
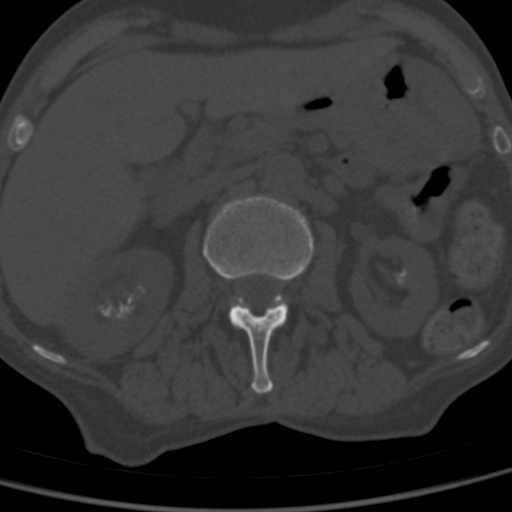

[11 of 20 positions shown; findings below may reference images not displayed]

FINDINGS: -------------------------------------------------------------------------------- 
------ 
GENERAL:  
There are 5 lumbar-type vertebral bodies with an additional transitional 
lumbosacral segment. Nomenclature will be as per prior exams with the 
transitional lumbosacral segment referred to as L5. There is transpedicular 
screw rod fixation L3-S1 with posterior bone grafting and disc devices. Hardware 
is intact. No hardware fracture. No lucency to suggest mechanical loosening. 
There is partial interbody osseous fusion. No vertebral body fracture. No 
spondylolisthesis. Degenerative changes SI joints. Bone graft donor sites. 
Hemangioma left side of the sacrum. 
There are numerous bilateral renal medullary calcifications. There is 
right-sided hydronephrosis mild to moderate, new since the prior MR exam of the 
lumbar spine of 09/07/2020. There is questionable obstructing stone at the level 
of the mid right ureter measuring approximately 4 mm, coronal series 6, image 
36. 
-------------------------------------------------------------------------------- 
------ 
SEGMENTAL: 
T12-L1: No discrete spinal canal or neural foraminal stenosis. 
L1-L2: No discrete spinal canal or neural foraminal stenosis. 
L2-L3: No discrete disc herniation. Moderate facet hypertrophy. Mild to moderate 
spinal canal stenosis is again suggested. 
L3-L4 through L5-S1: Posterior changes including posterior decompression. Thin 
peripheral dystrophic calcification about the dorsal aspect of the thecal sac 
and extending within the midline, this was present on the prior MR exam or 
arachnoiditis was present. 

-------------------------------------------------------------------------------- 
------
IMPRESSION: 1.  Post surgical changes with posterior fusion spanning L5-S1. No mechanical 
loosening or hardware failure. 
2.  Mild to moderate spinal canal stenosis L2-L3. 
3.  Medullary nephrocalcinosis with interval development of mild to moderate 
right-sided hydronephrosis.  
Findings were discussed with the referring service by myself at the time of this 
dictation. 
RADIATION DOSE REDUCTION: All CT scans are performed using radiation dose 
reduction techniques, when applicable.  Technical factors are evaluated and 
adjusted to ensure appropriate moderation of exposure.  Automated dose 
management technology is applied to adjust the radiation doses to minimize 
exposure while achieving diagnostic quality images.

## 2022-01-04 IMAGING — MR MRI LUMBAR SPINE WITHOUT CONTRAST
5 of 9 series · 12 of 48 positions shown · IV contrast (gadolinium)
Comparison: CT lumbar spine January 03, 2022 and CT lumbar spine January 10, 2021 and 
MRI lumbar spine September 07, 2020.

________________________________________________________________________________________________ 
MRI LUMBAR SPINE WITHOUT CONTRAST, 01/04/2022 [DATE]: 
CLINICAL INDICATION: Spinal stenosis. Chronic low back pain. Right leg pain.
TECHNIQUE: Multiplanar, multiecho position MR images of the lumbar spine were 
performed without intravenous gadolinium enhancement. Patient was scanned on a 
1.5T magnet.

[Series 101: survey · axial · 10.0mm · 1.25mm/px · z∈[-33,+201]mm · 2 of 10 slices shown]
[im 1/10]
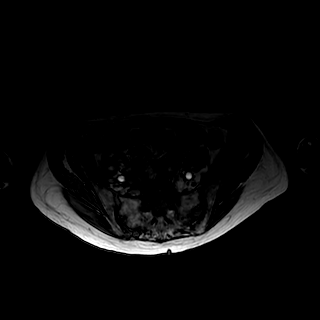
[im 10/10]
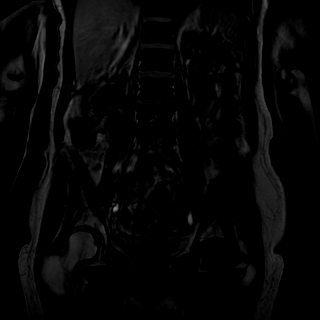

[Series 201: t2w_cor-surv · coronal · 6.0mm · 0.62mm/px · 1 of 10 slices shown]
[im 1/10]
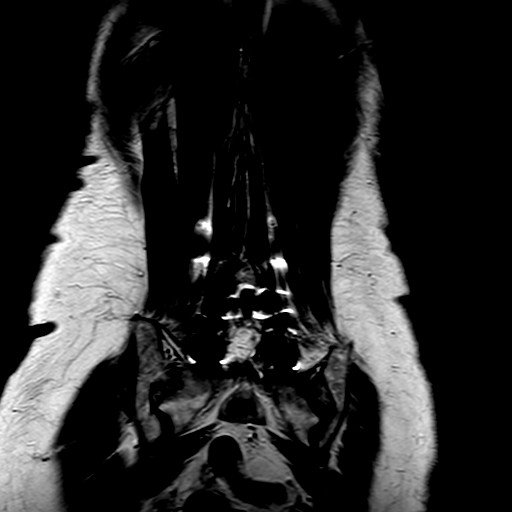

[Series 301: t1_tse_sag · sagittal · 4.0mm · 0.48mm/px · 2 of 18 slices shown]
[im 1/18]
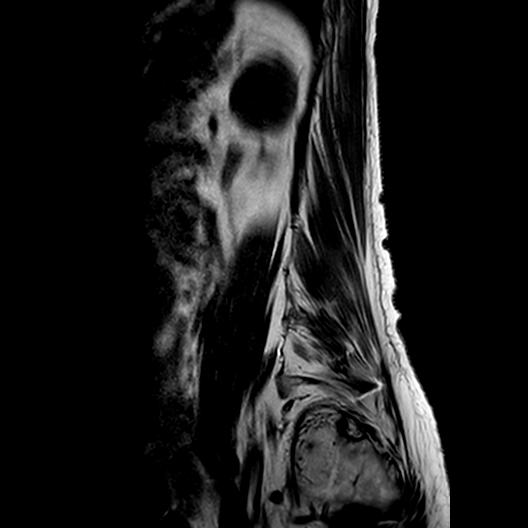
[im 18/18]
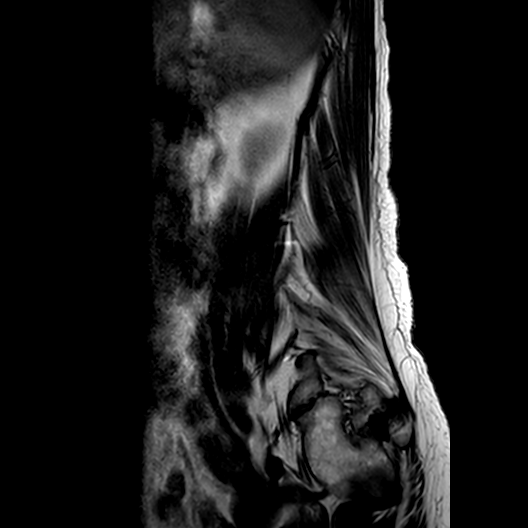

[Series 402: (id) view_ax mpr · axial · 1.0mm · 0.25mm/px · z∈[-24,+1]mm · 2 of 124 slices shown]
[im 9/124]
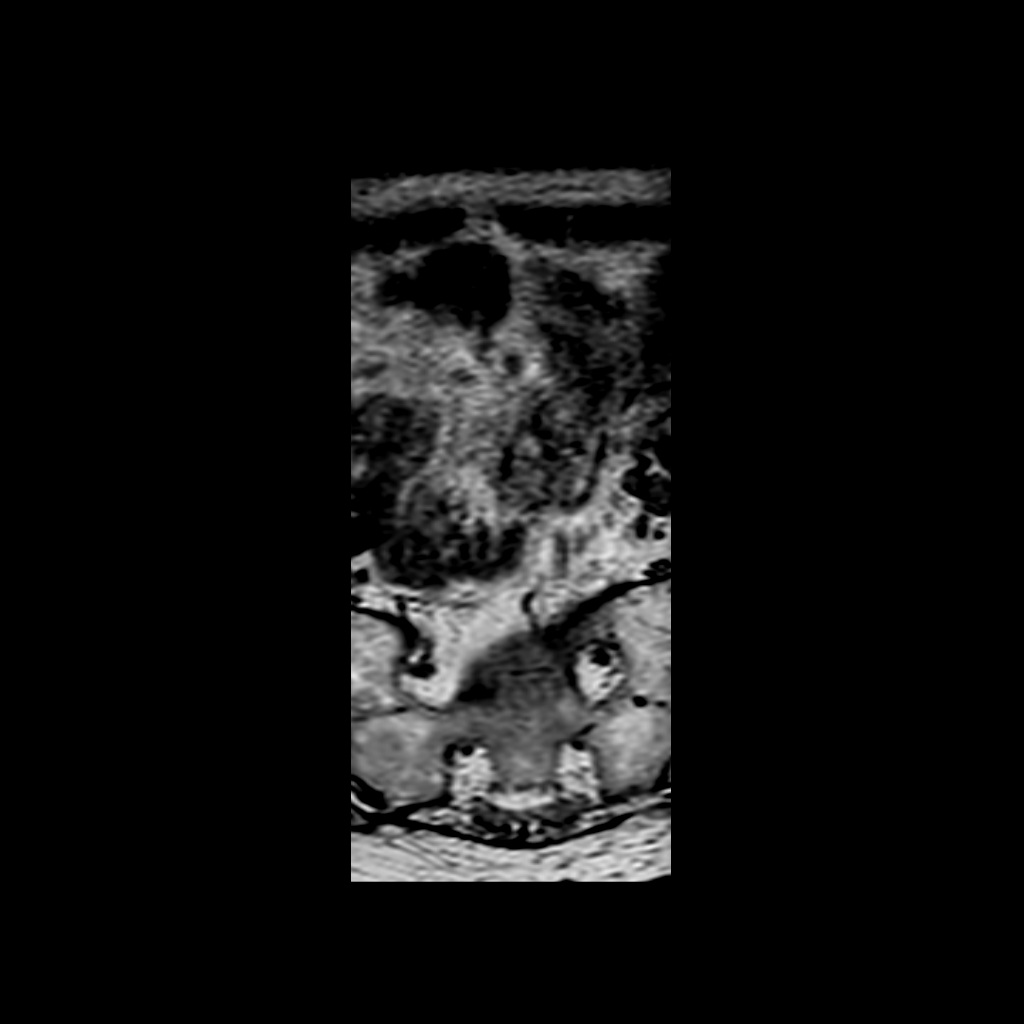
[im 25/124]
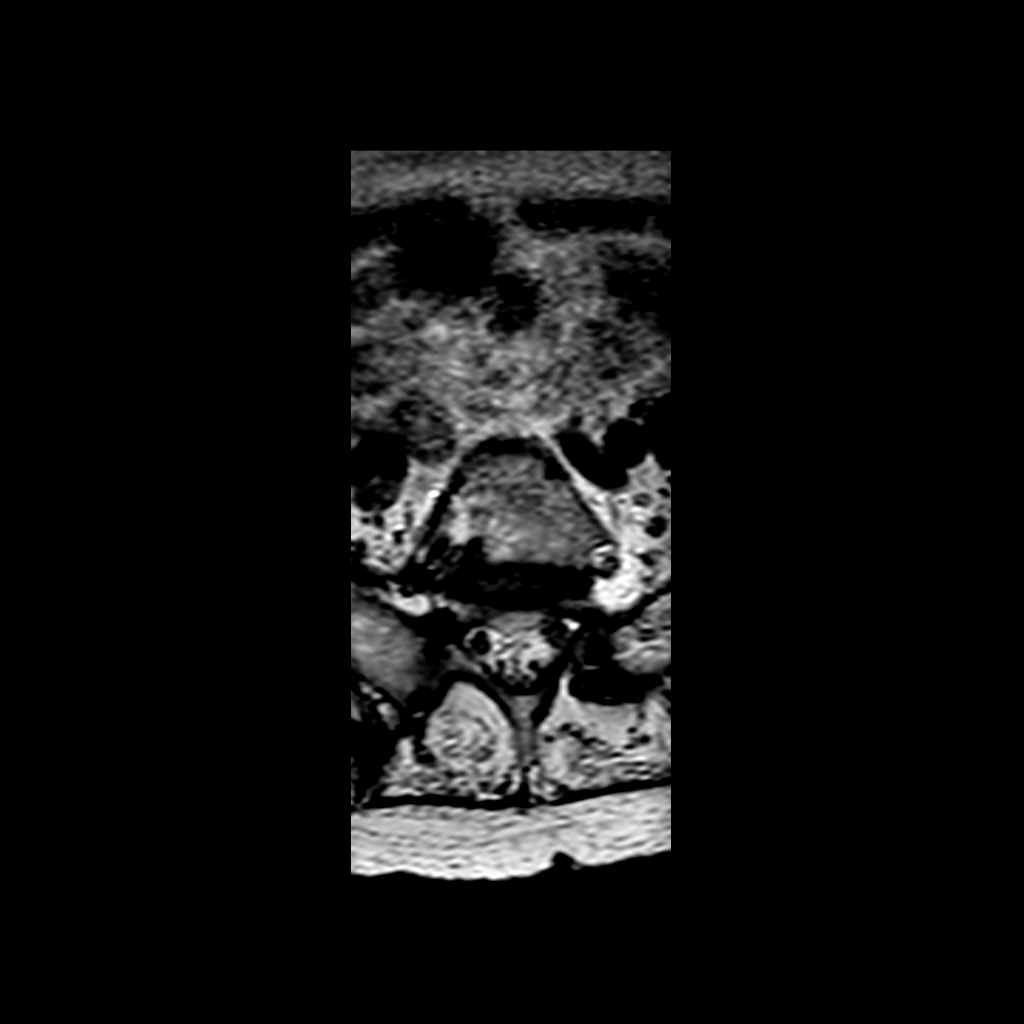

[Series 701: T1 · axial · 4.0mm · 0.33mm/px · z∈[-37,+137]mm · 5 of 40 slices shown]
[im 1/40]
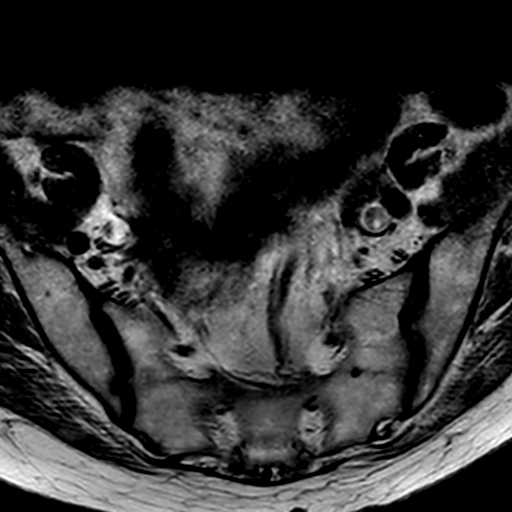
[im 10/40]
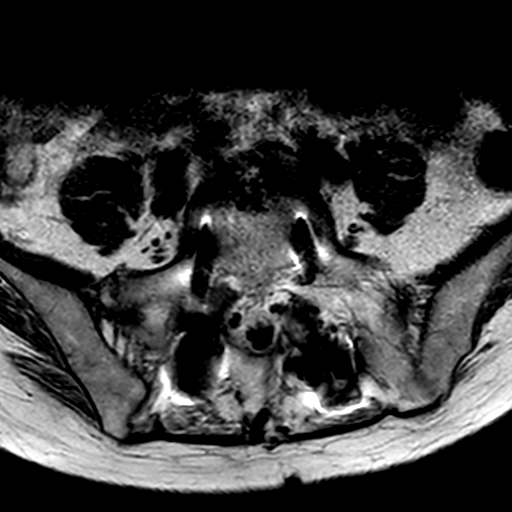
[im 20/40]
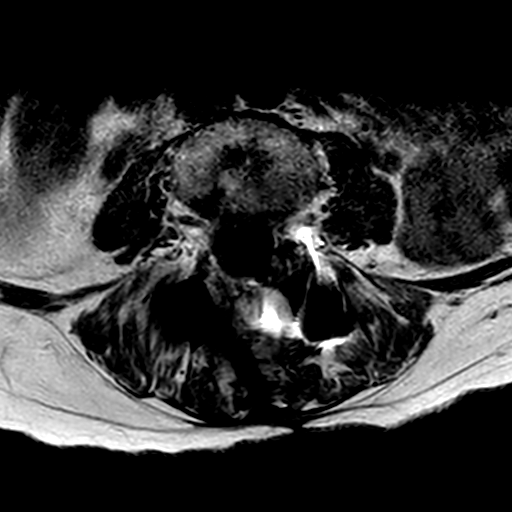
[im 30/40]
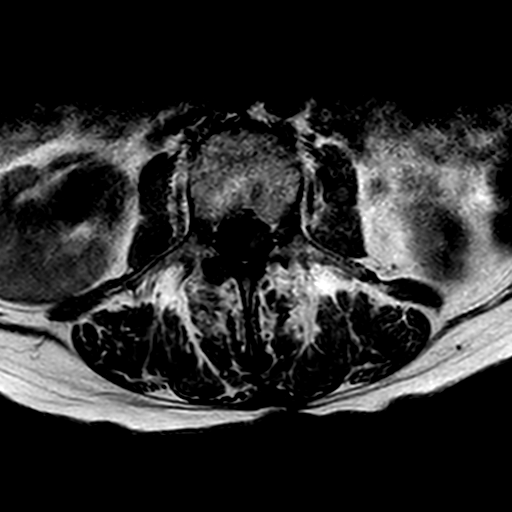
[im 40/40]
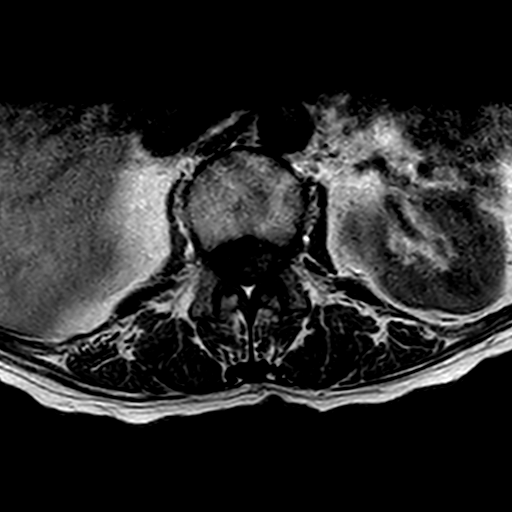

[12 of 48 positions shown; findings below may reference images not displayed]

FINDINGS: -------------------------------------------------------------------------------- 
------ 
GENERAL: 
Nomenclature is based on 5 lumbar type vertebral bodies.    Again noted is 
posterior spinal fusion L3-S1 with bilateral pedicle screws and longitudinal 
connecting rods. Anterior interbody graft material at L3-L4, L4-L5 and L5-S1. 
Laminectomy L3-L4, L4-L5 and L5-S1 levels. 
There is stable appearance of peripherally loculated cauda equina nerve roots at 
the inferior L3 level and extending to the L4-L5 level. Appearance suggestive of 
an intradural cystic structure from mid L3 through mid L4 is stable which 
displaces the cauda equina nerve roots peripherally with an empty sac 
appearance. Findings consistent with a degree of arachnoiditis and the 
appearance is stable. 
ALIGNMENT: Minimal dextroconvex lumbar scoliosis. 
VERTEBRAL BODY HEIGHT: Normal.  
MARROW SIGNAL: No focal suspect signal abnormality. 
CORD SIGNAL: Normal distal spinal cord. See above regarding cauda equina nerve 
roots. Conus medullaris terminates at L1. 
ADDITIONAL FINDINGS: Prominence of the right renal pelvis as was suggested on 
recent CT lumbar spine could indicate right-sided hydronephrosis. 
Modic I-II: None. 
Ligamentum Flavum > 2.5 mm: All levels except the laminectomy levels. 
-------------------------------------------------------------------------------- 
------ 
SEGMENTAL: 
T12-L1: Loss of disc signal. Otherwise normal. Stable. 
L1-L2: Loss of disc signal. Otherwise normal. Stable. 
L2-L3: Mild loss of disc height with loss of disc signal. Loss of disc height 
has progressed. Mild to moderate canal stenosis with slight narrowing of the 
lateral recesses bilaterally, worse on the left. Significant ligamental 
hypertrophy. Mild to moderate facet arthropathy. Mild left foraminal narrowing. 
Right foramen is patent. The degree of canal stenosis has slightly progressed 
compared to the prior MRI study. 
L3-L4: Fusion level. Empty sac appearance. Foramina patent. Facets are difficult 
to visualize. Stable. 
L4-L5: Fusion level. Canal patent. Foramina patent. Facets difficult to 
visualize. Stable.  
L5-S1: Fusion level. Mild narrowing left lateral recess. Canal patent. Left 
foramen patent. Right foramen is difficult to visualize but appears to be mildly 
narrowed. Facets difficult to visualize. Stable. 
-------------------------------------------------------------------------------- 
------
IMPRESSION: Lumbosacral anterior posterior spinal fusion with posterior decompression 
detailed above. 
Stable appearance of the thecal sac, detailed above, suggestive of 
arachnoiditis. 
Prominence of the right renal pelvis as was suggested on recent CT lumbar spine 
could indicate right-sided hydronephrosis. 
L2-L3, mild to moderate canal stenosis appears to have slightly progressed 
compared to the prior MRI study. Slight narrowing lateral recesses bilaterally, 
worse on the left. Mild left foraminal narrowing.

## 2022-04-23 IMAGING — DX HIP 2 VIEWS RIGHT WITH PELVIS
1 series · 3 of 3 positions shown · non-contrast
Comparison: 01/03/2022 CT lumbar spine

________________________________________________________________________________________________ 
HIP 2 VIEWS RIGHT WITH PELVIS, 04/23/2022 [DATE]: 
CLINICAL INDICATION: Sacroiliitis, Not Elsewhere Classified.

[Series 1: AP · U · 0.14mm/px · 3 of 3 slices shown]
[im 1/3]
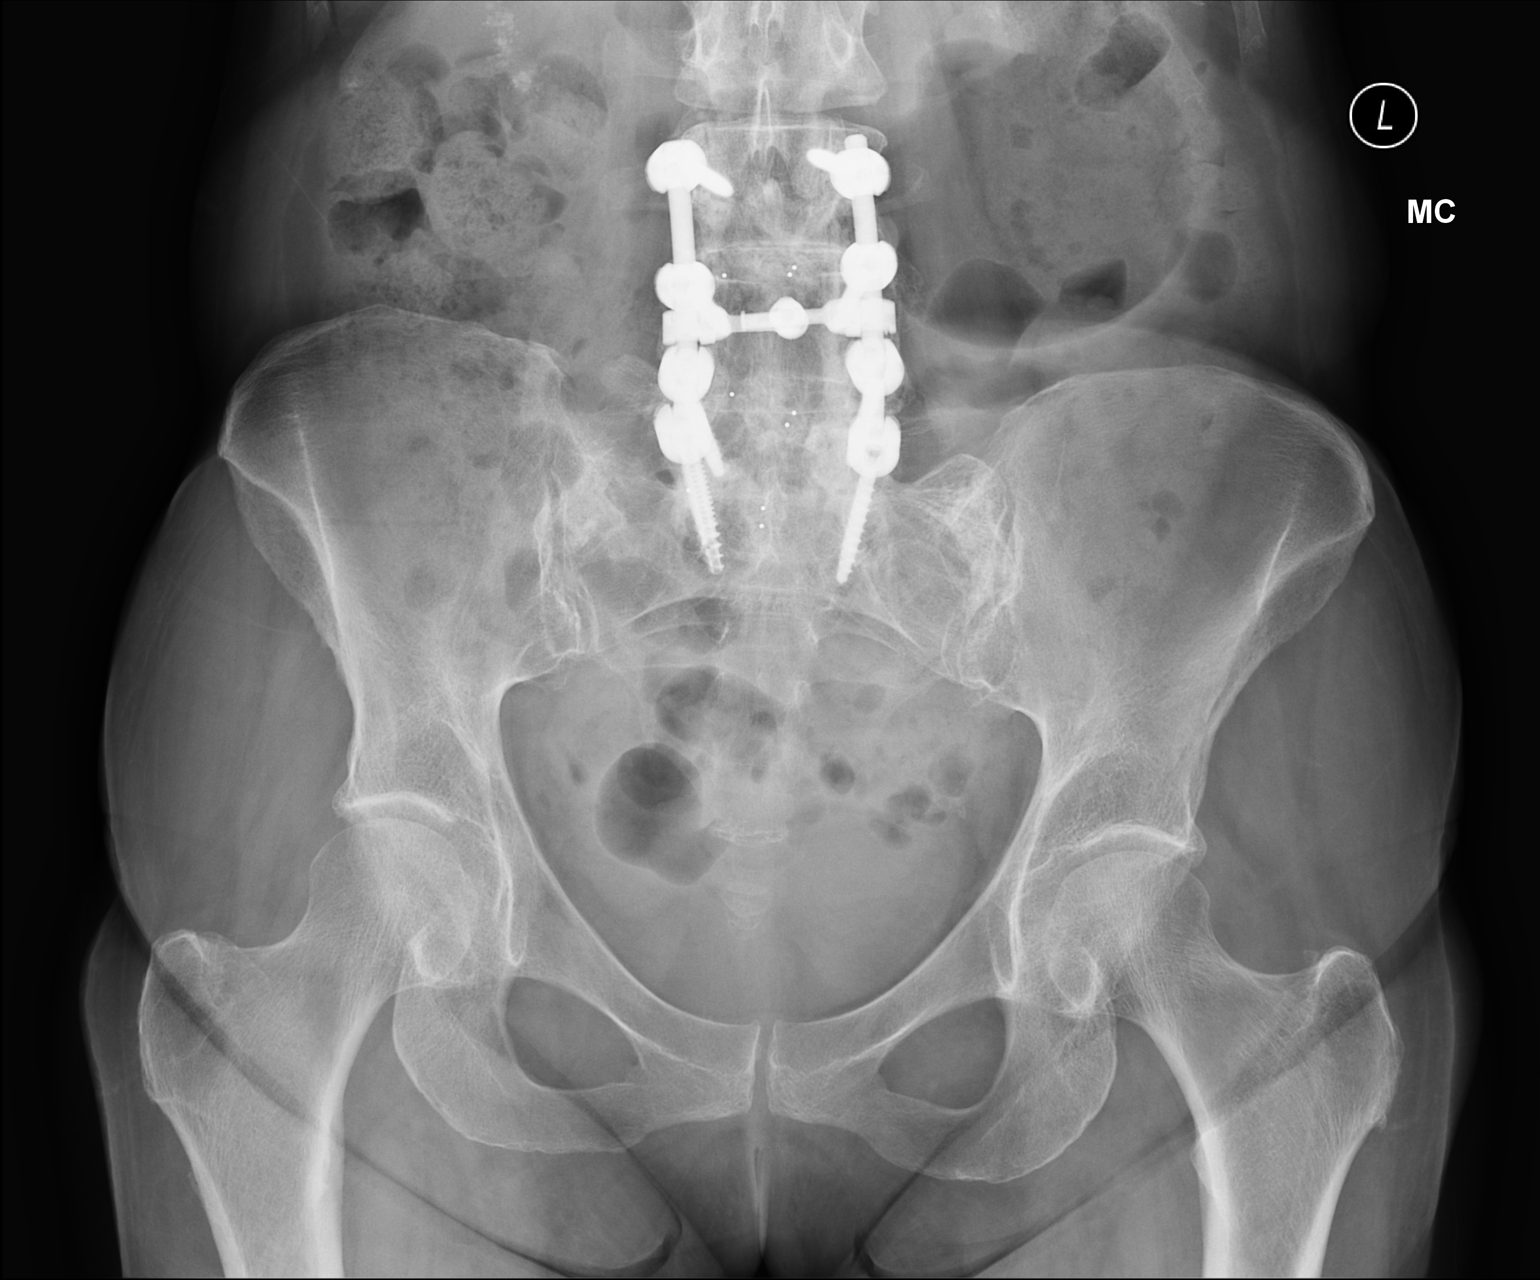
[im 2/3]
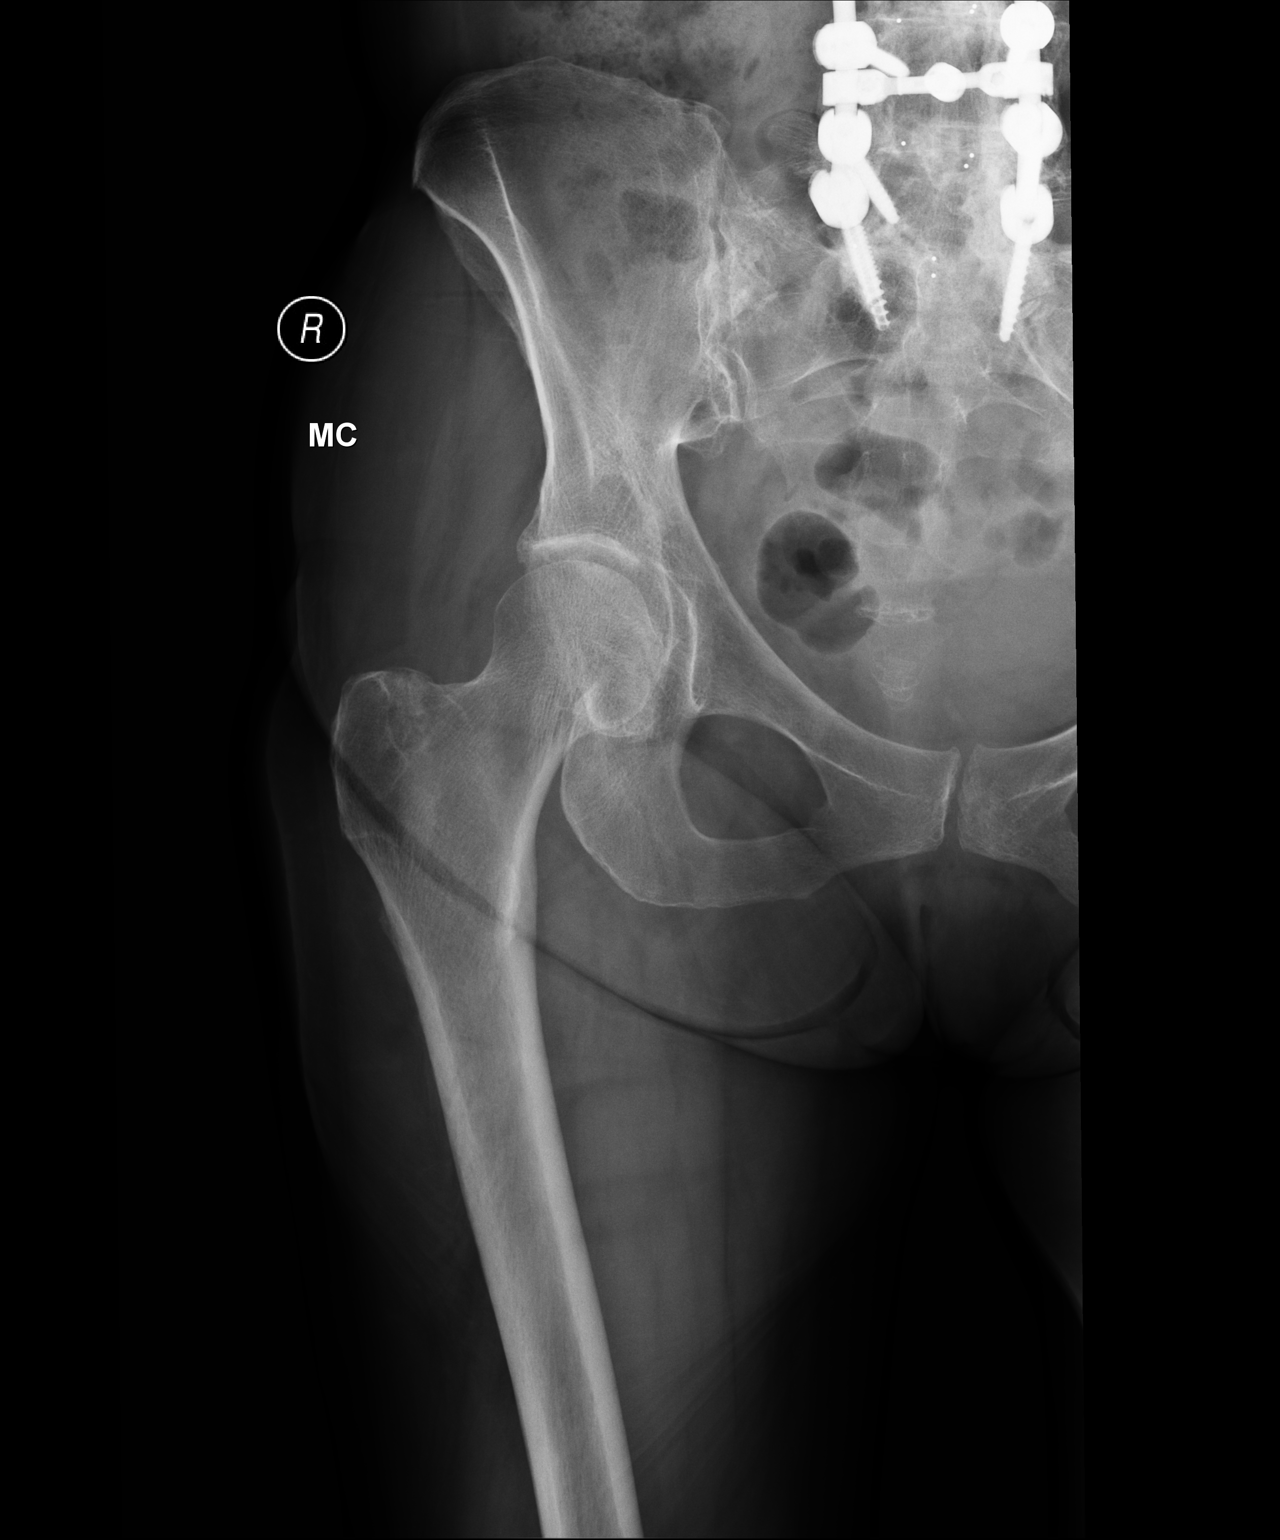
[im 3/3]
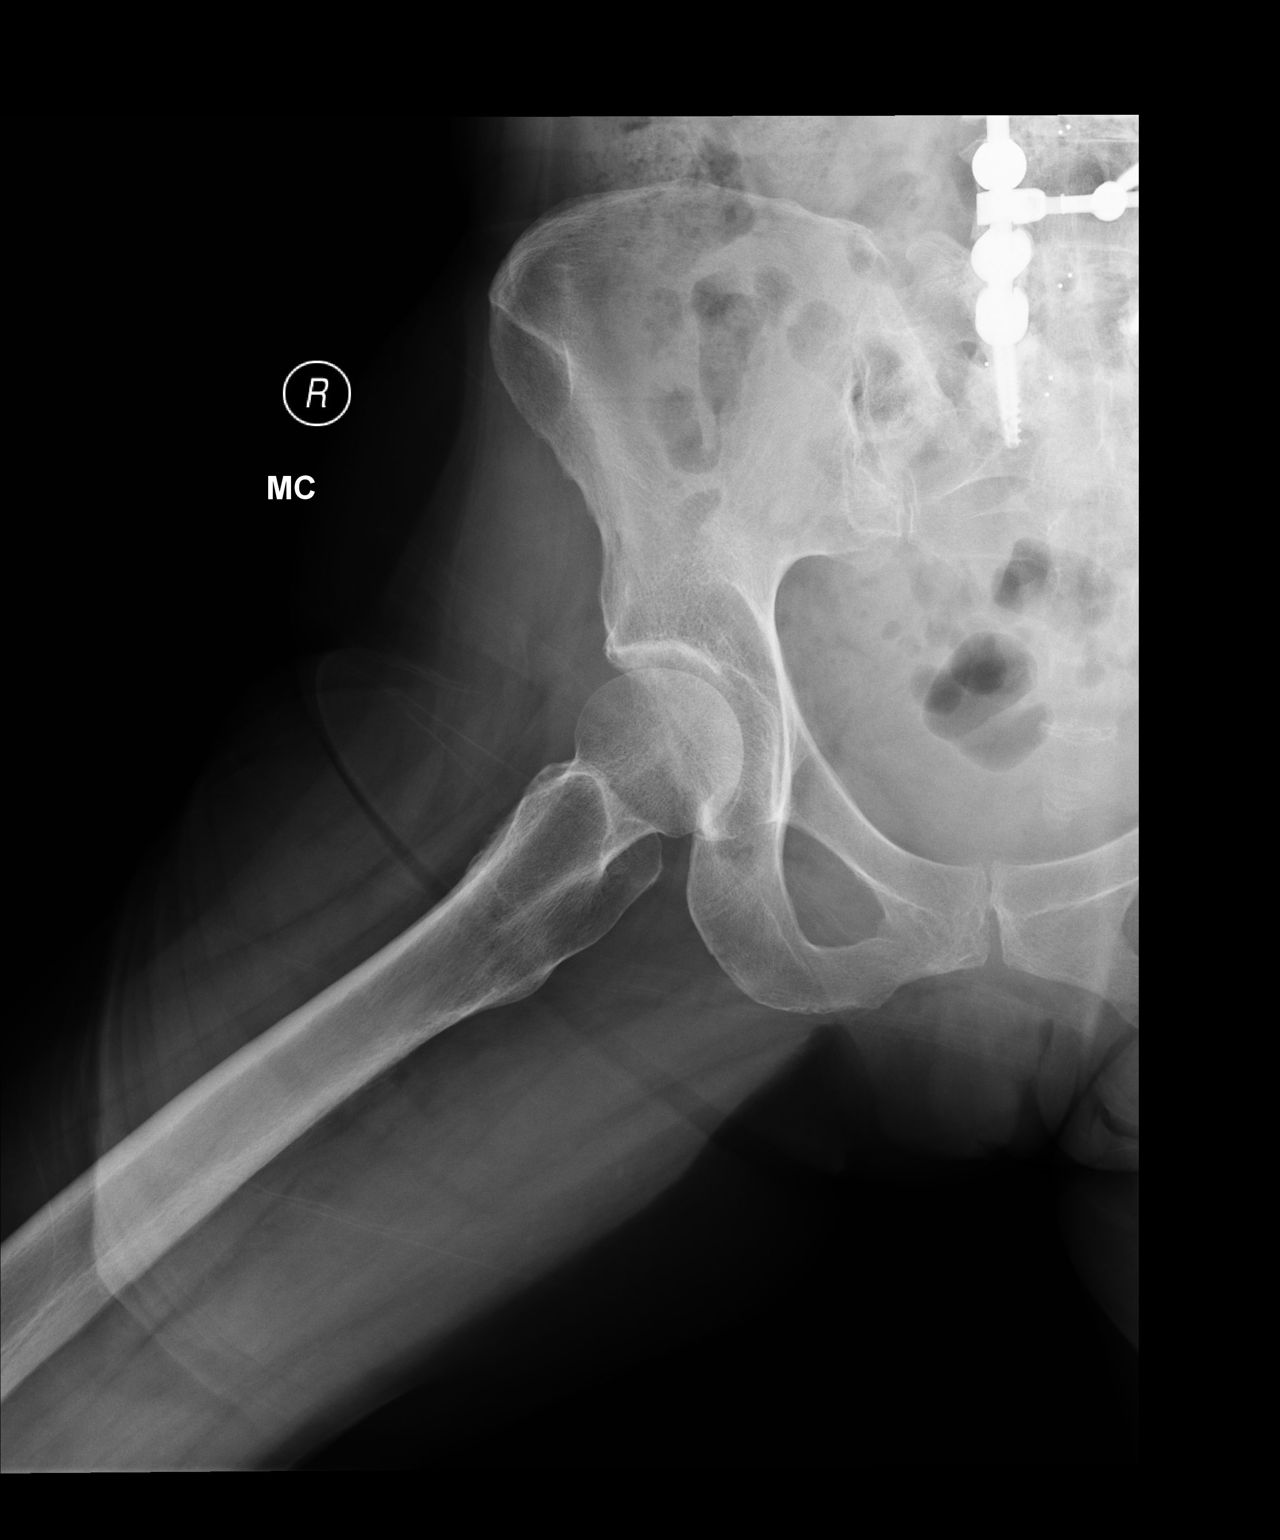

[3 of 3 positions shown; findings below may reference images not displayed]

FINDINGS: No fracture. No sacroiliitis. Degenerative change of the SI joints. 
Increased density adjacent to the SI joints corresponds to posterior medial 
iliac osteonecrosis demonstrated on CT. Transitional lumbosacral segment and 
lumbosacral posterior decompression, pedicle screw/rod fixation with crossbar 
and intervertebral disc spacers. Hip joints are preserved. Osteopenia.
IMPRESSION: No sacroiliitis.

## 2022-05-01 IMAGING — MR MRI RIGHT HIP WITHOUT CONTRAST
4 of 6 series · 18 of 40 positions shown · IV contrast (gadolinium)
Comparison: 04/23/2022 radiographs

________________________________________________________________________________________________ 
MRI RIGHT HIP WITHOUT CONTRAST, 05/01/2022 [DATE]: 
CLINICAL INDICATION: Right hip pain.
TECHNIQUE: Multiplanar, multiecho position MR images of the hip were performed 
without intravenous gadolinium enhancement. Large field-of-view images were 
performed of the pelvis to include the contralateral hip for comparison. Patient 
was scanned on a 3T magnet.

[Series 201: T1 · axial · 5.0mm · 0.62mm/px · z∈[-91,+123]mm · 3 of 44 slices shown]
[im 5/44]
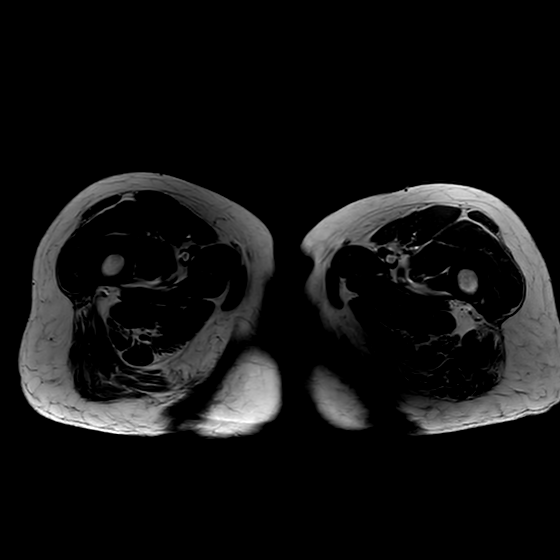
[im 24/44]
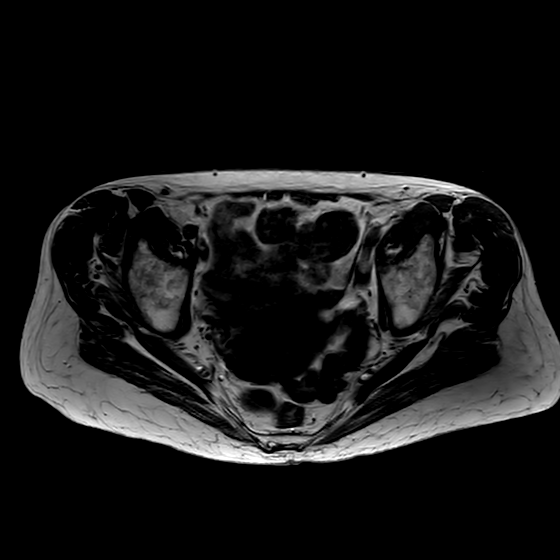
[im 39/44]
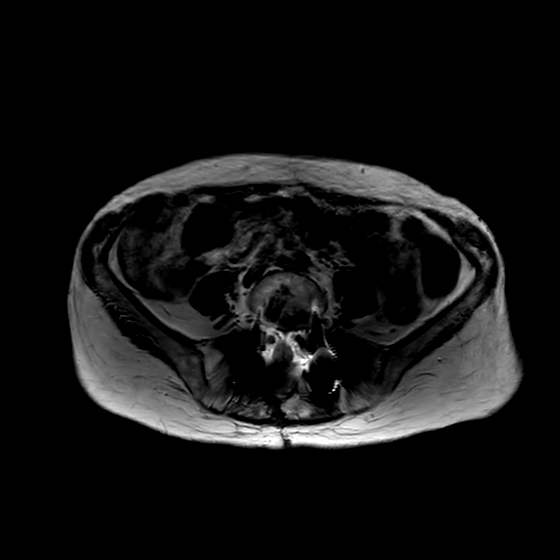

[Series 401: PD fat-sat · axial · 4.0mm · 0.35mm/px · z∈[-87,+56]mm · 7 of 32 slices shown (1 of 2)]
[im 1/32]
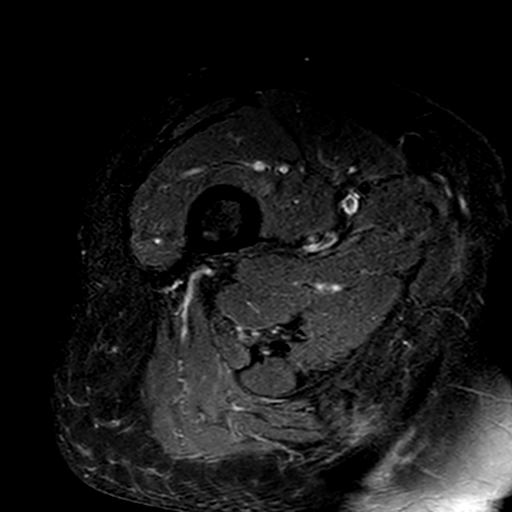
[im 6/32]
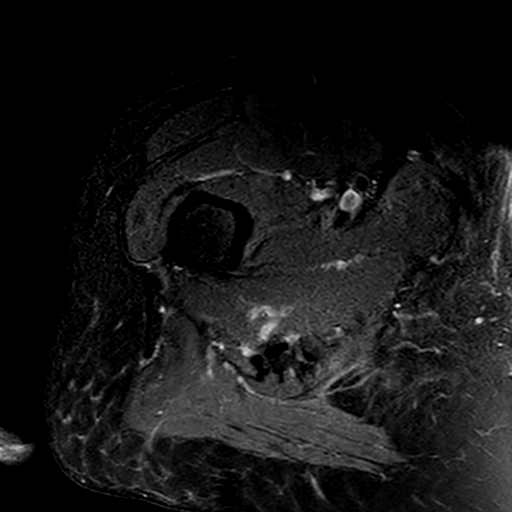
[im 11/32]
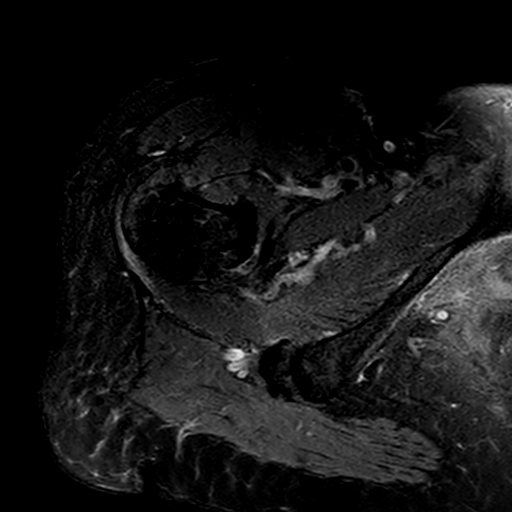
[im 16/32]
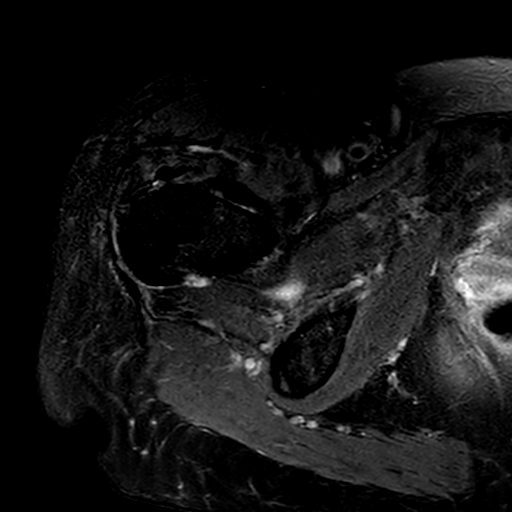
[im 21/32]
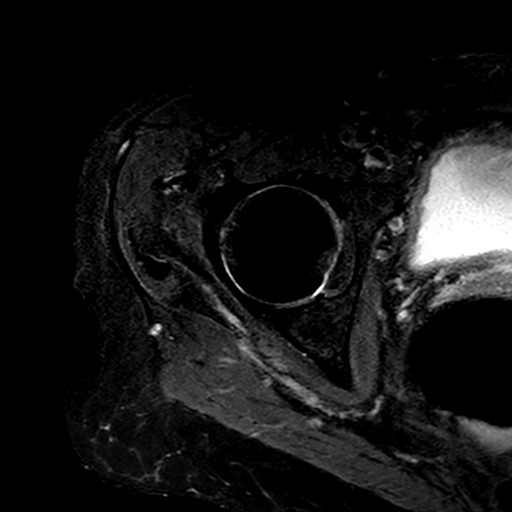
[im 26/32]
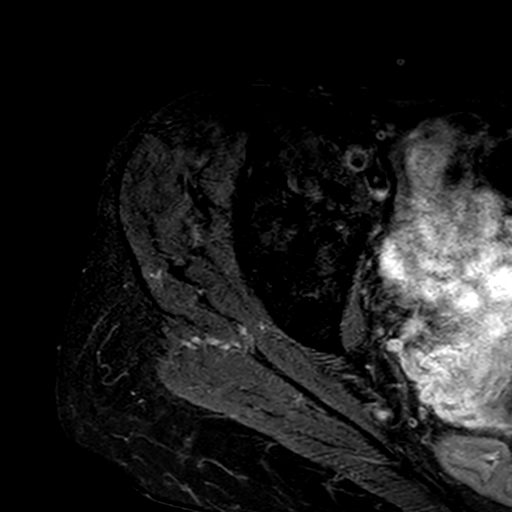
[im 32/32]
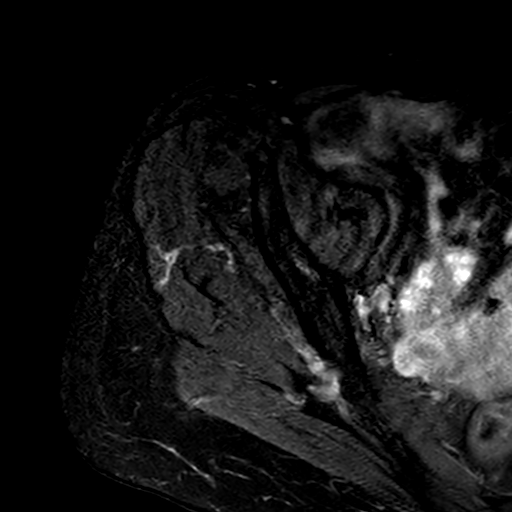

[Series 501: PD · sagittal · 3.5mm · 0.43mm/px · 3 of 36 slices shown]
[im 6/36]
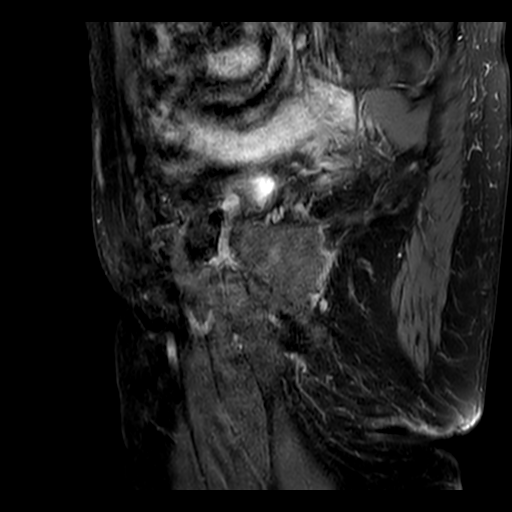
[im 21/36]
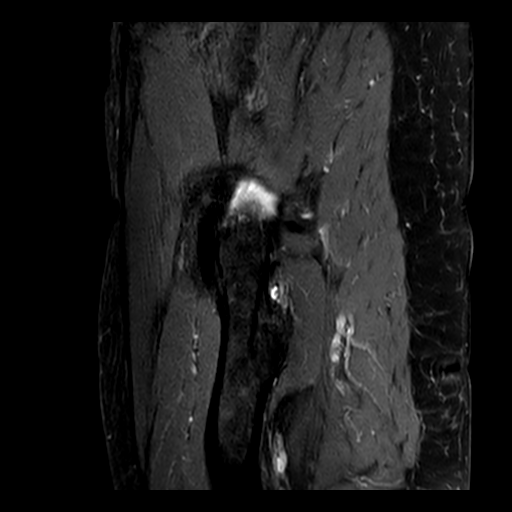
[im 31/36]
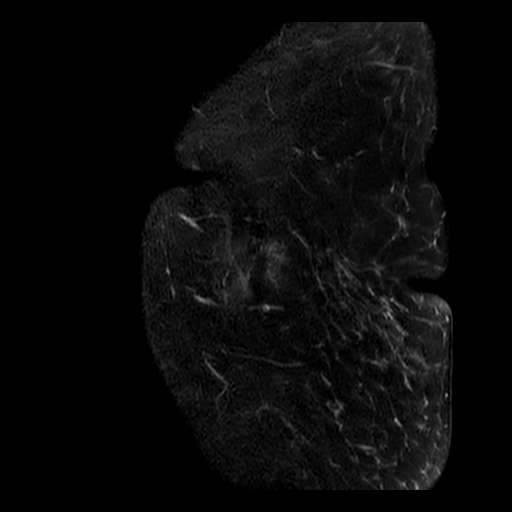

[Series 601: PD fat-sat · coronal · 3.2mm · 0.43mm/px · 5 of 20 slices shown (2 of 2)]
[im 1/20]
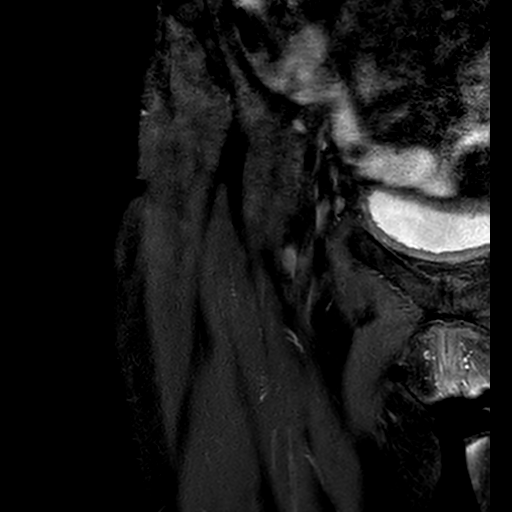
[im 5/20]
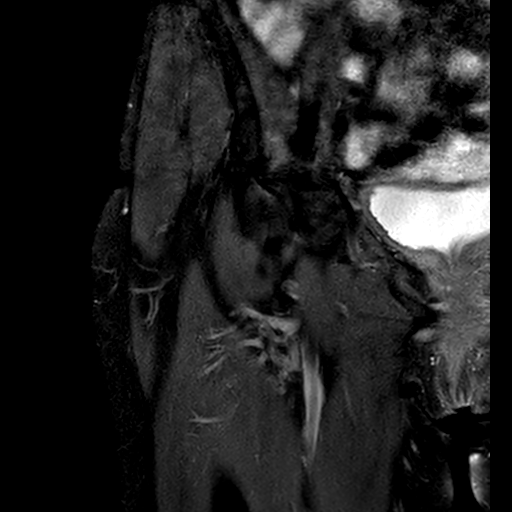
[im 10/20]
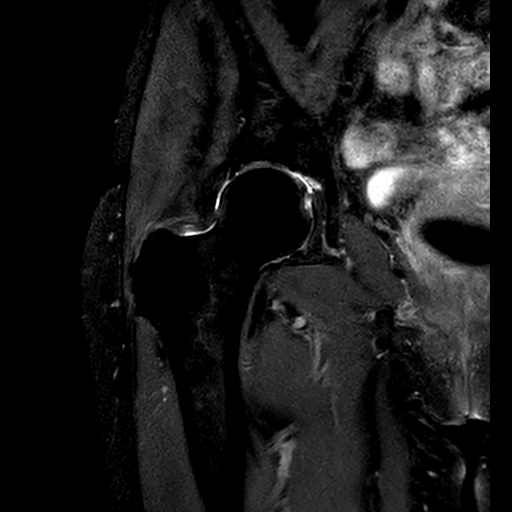
[im 15/20]
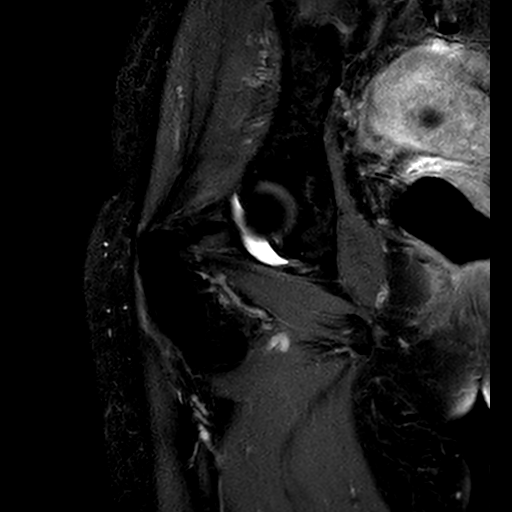
[im 20/20]
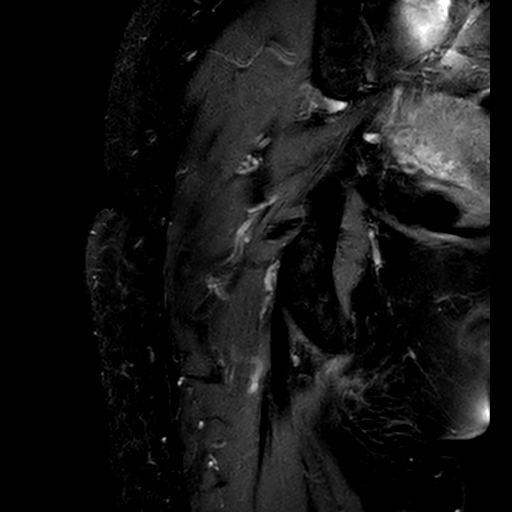

[18 of 40 positions shown; findings below may reference images not displayed]

FINDINGS: HIPS:  Partial-thickness chondromalacia of the hips. No labral tear. No 
paralabral cyst. Small right hip joint fluid. Both femoral heads maintain a 
spherical configuration without evidence of avascular necrosis or subarticular 
collapse. No abnormal morphology of the proximal femurs or acetabula to 
predispose to impingement.  
BONES: No fracture. Left sacral alar hemangioma. 2.3 x 0.9 cm right and 1.9 x 
1.1 cm left posterior medial iliac heterogeneous, well defined signal foci 
correspond to sclerotic focus and mild fragmentation on CT, consistent with 
osteonecrosis. SI joint degenerative change. No sacroiliitis. Transitional 
lumbosacral segment Lumbosacral posterior decompression, pedicle screw fixation 
and intervertebral disc spacers. 
SOFT TISSUES: The bilateral abductor cuffs are preserved. The insertions of the 
iliopsoas tendons are intact. The origins of the hamstrings are preserved. 
Rectus abdominis-adductor complex is preserved. The musculature is symmetric 
without strain, atrophy or mass. No focal fluid collection or distended bursa. 
Specifically, no iliopsoas or trochanteric bursitis. Included neurovascular 
bundles are negative. 1.2 cm uterine fibroid. Colonic diverticulosis. 
1.  IMPRESSION:  
2.  Mild degenerative change of the hips and small right hip joint fluid. 
3.  2.3 x 0.9 cm right and 1.9 x 1.1 cm left posteromedial iliac osteonecrosis.  
4.  SI joint degenerative change.  
5.  Lumbosacral posterior decompression, pedicle screw fixation and 
intervertebral disc spacers. 
6.  1.2 cm uterine fibroid.  
7.  Colonic diverticulosis.

## 2022-06-19 IMAGING — CT CT LUMBAR SPINE WITHOUT CONTRAST
3 of 10 series · 12 of 33 positions shown, 13 images · non-contrast
Comparison: CT lumbar spine from January 03, 2022.

________________________________________________________________________________________________ 
CT LUMBAR SPINE WITHOUT CONTRAST, 06/19/2022 [DATE]: 
CLINICAL INDICATION: Progressive lumbar spine pain since spine surgery with 
radiation to right knee. 
A search for DICOM formatted images was conducted for prior CT imaging studies 
completed at a non-affiliated media free facility.
TECHNIQUE: The lumbar spine was scanned from T12 through mid-sacrum without 
contrast on a high-resolution CT scanner using dose reduction techniques. . 
Count of known CT and Cardiac Nuclear Medicine studies performed in the previous 
12 months = 1.

[Series 3: l spine 2.0 b31s · axial · 0.31mm/px · z∈[-486,-296]mm · 4 of 134 slices shown, 5 images]
[im 20/134  soft-tissue]
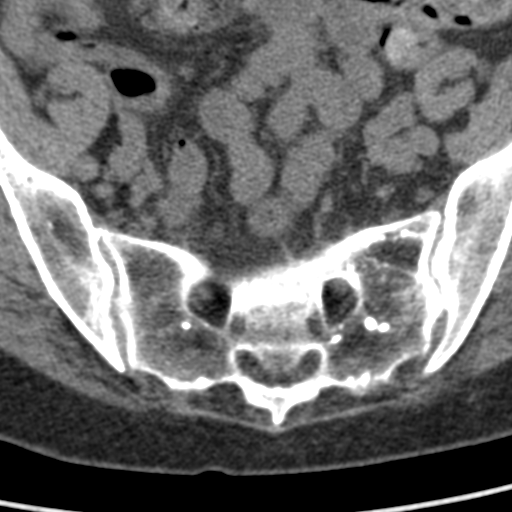
[im 20/134  bone]
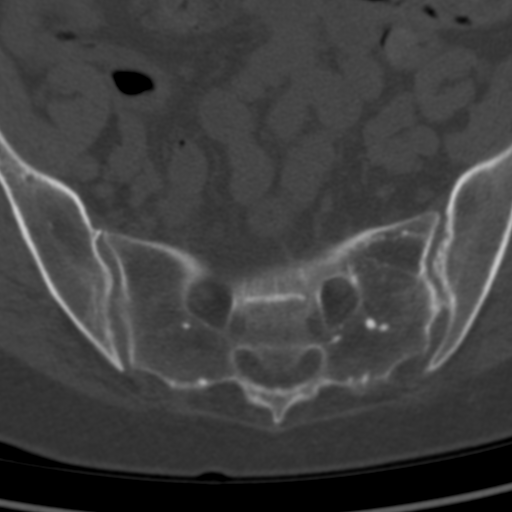
[im 58/134  bone]
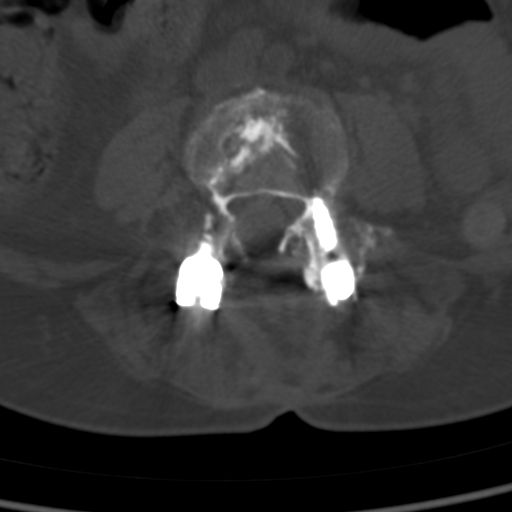
[im 77/134  bone]
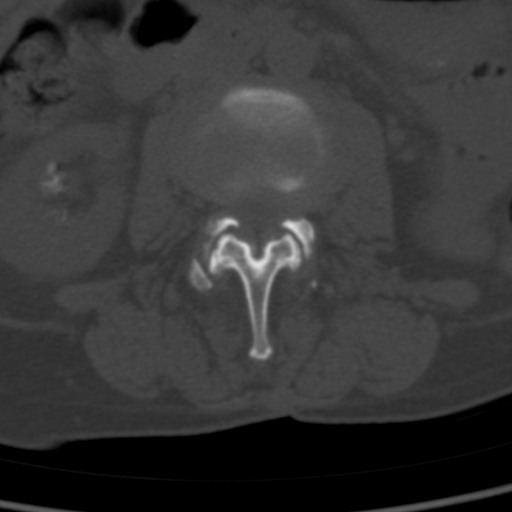
[im 115/134  bone]
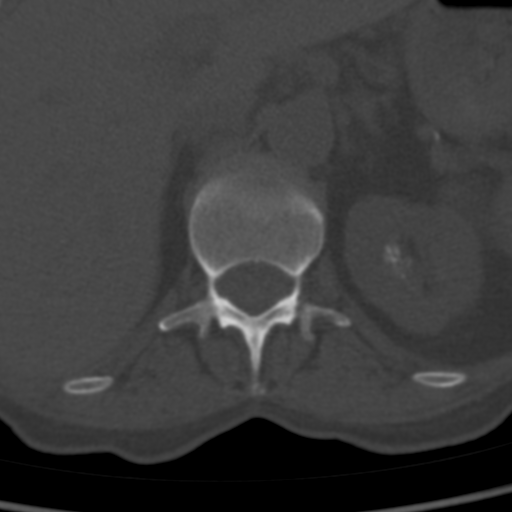

[Series 6: l spine coronal · coronal · 0.35mm/px · 3 of 74 slices shown]
[im 15/74  bone]
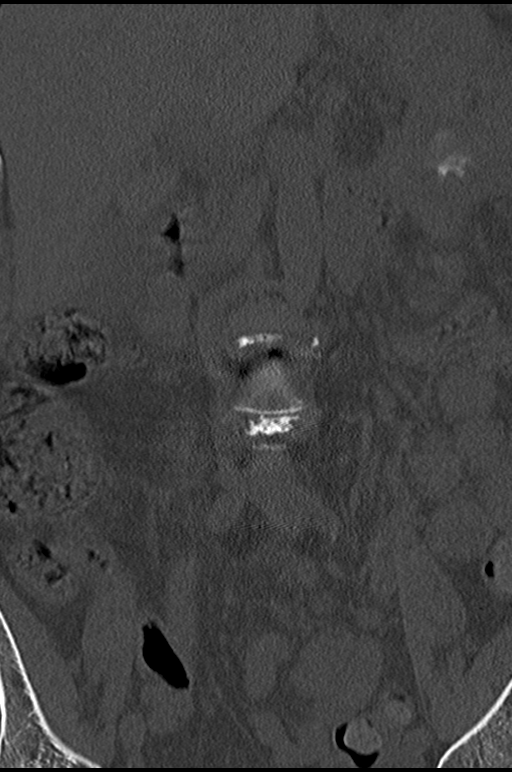
[im 30/74  bone]
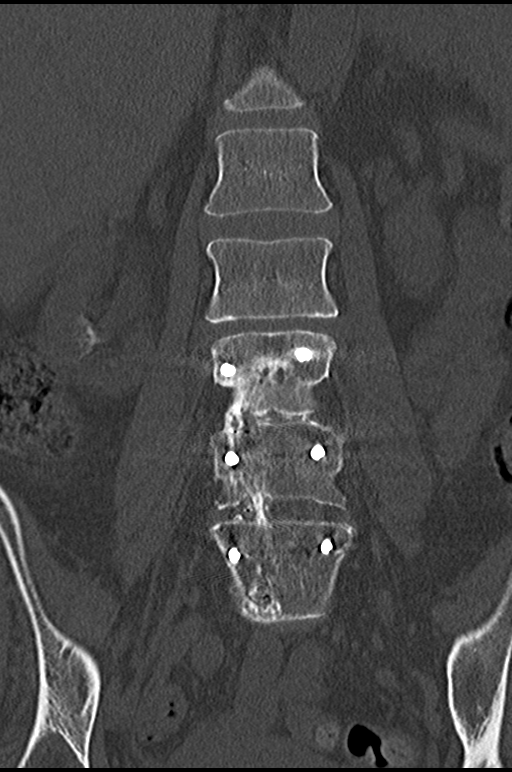
[im 44/74  bone]
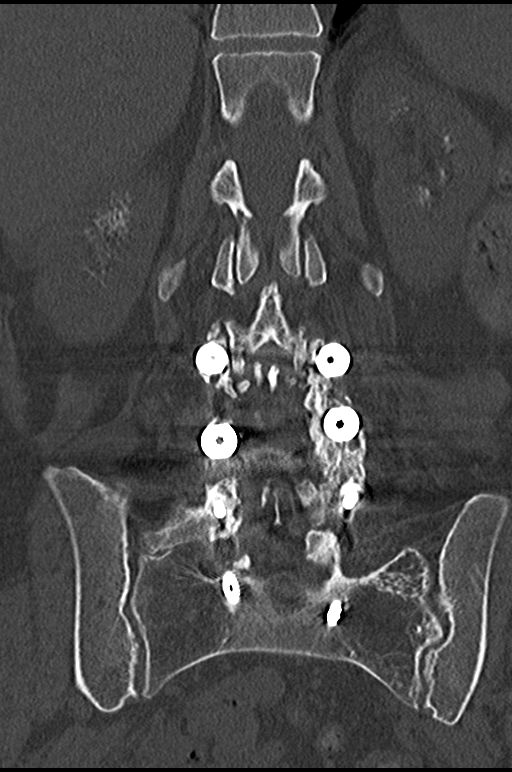

[Series 7: l spine sag · sagittal · 0.34mm/px · 5 of 88 slices shown]
[im 15/88  bone]
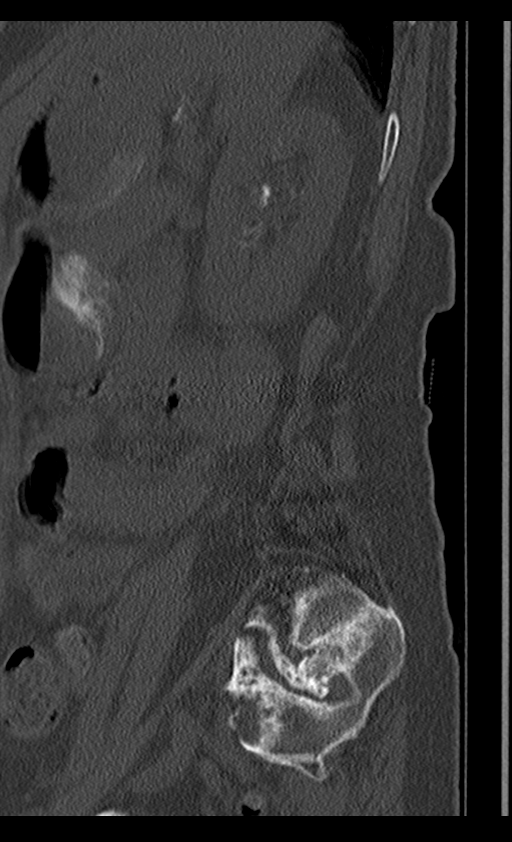
[im 30/88  bone]
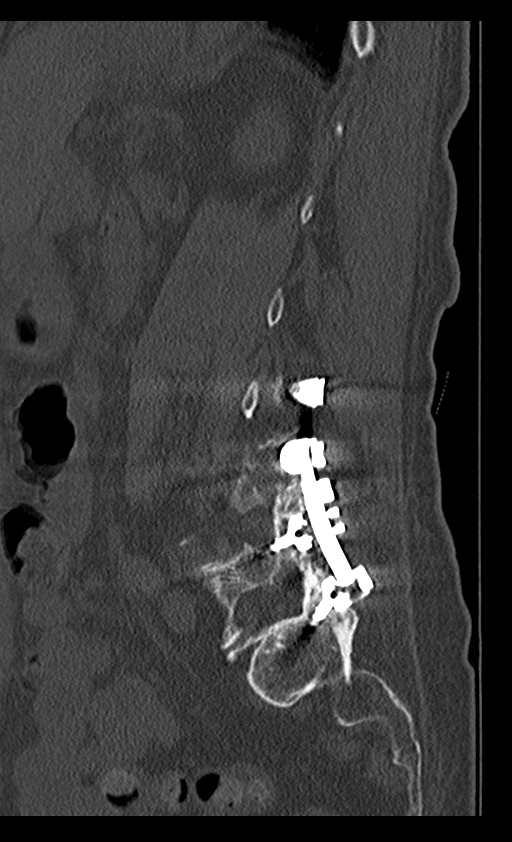
[im 44/88  bone]
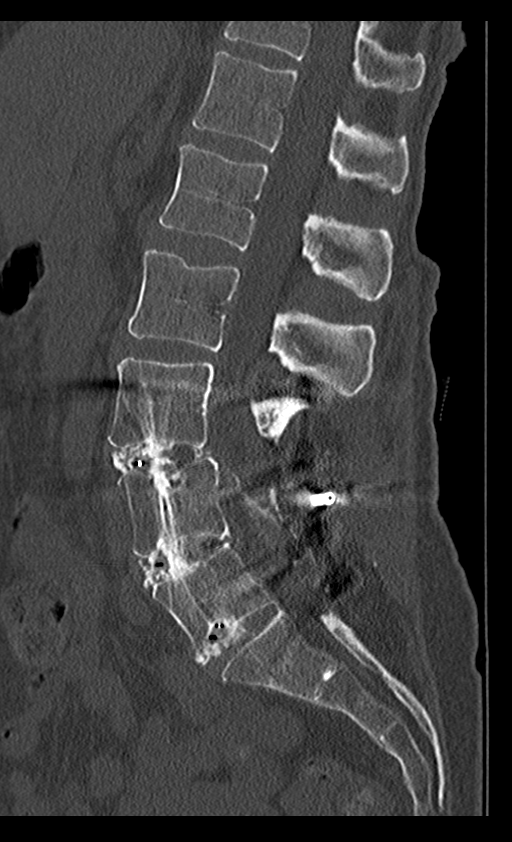
[im 59/88  bone]
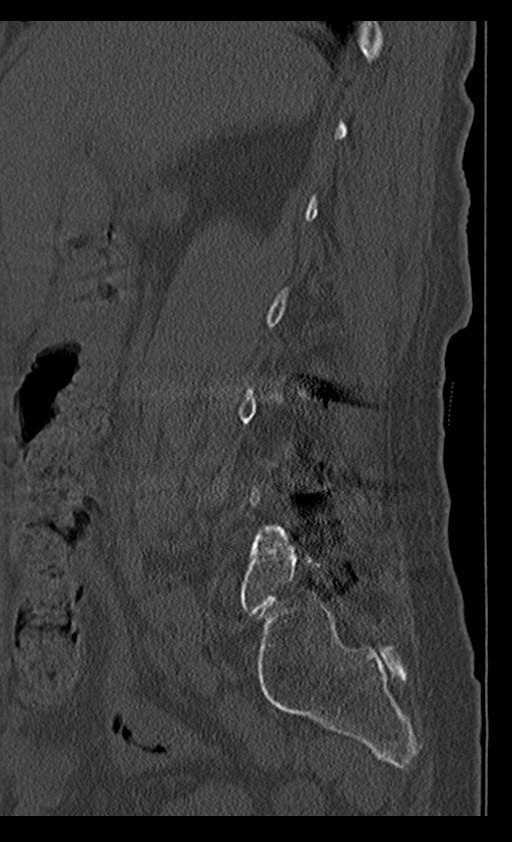
[im 73/88  bone]
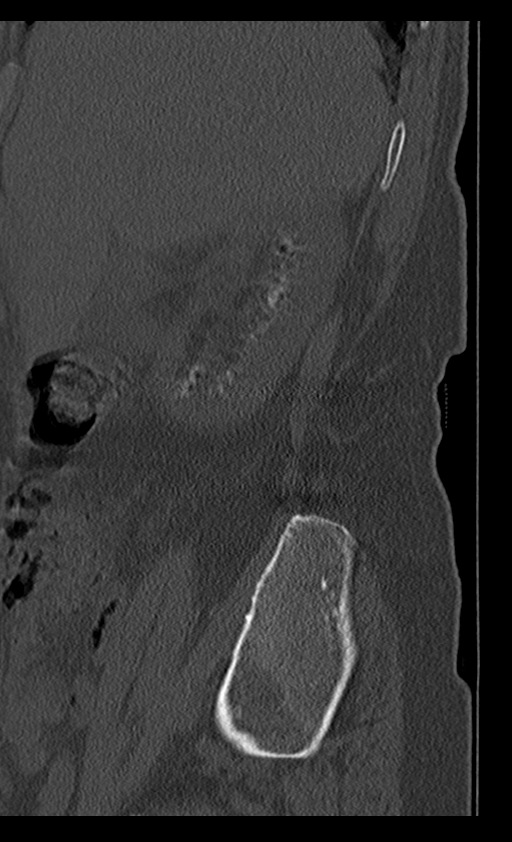

[12 of 33 positions shown; findings below may reference images not displayed]

FINDINGS: -------------------------------------------------------------------------------- 
------ 
SURGICAL CHANGE: 
Postsurgical changes of lumbar hardware fusion from L3 through S1 with bilateral 
pedicle screws and posterior fusion rods. Discectomy with intervertebral spacer 
placed at the L3-L4, L4-L5, L5-S1 levels. There is lucency surrounding the right 
L3 pedicle screw proximally concerning which is stable. There is osseous fusion 
across the discectomy sites. 
-------------------------------------------------------------------------------- 
------ 
GENERAL: 
Stable mild anterolisthesis of L3 relative to L4 and stable minimal 
retrolisthesis of L2 relative to L3. No acute fracture. No focal suspicious 
lytic or blastic lesions. Sacralization of L5 is noted on the right side. 
Surgical defects along bilateral iliac wings. Visualized extraspinal soft 
tissues reveal bilateral nephrolithiasis. Sigmoid diverticulosis. Aortic 
vascular calcification. 
There is calcification along the central canal at L4 and L5, stable, likely from 
sequelae of arachnoiditis. 
-------------------------------------------------------------------------------- 
------ 
SEGMENTAL: 
Multilevel discogenic/degenerative changes are present. 
Levels with moderate/severe central canal narrowing: L2-L3 (stable) 
Levels with moderate/severe neural foraminal narrowing: None. 
-------------------------------------------------------------------------------- 
------
IMPRESSION: 1.  Stable findings of lumbar fusion from L3 through S1. 
2.  Stable lucency surrounding the right L3 pedicle screw proximally. 
3.  Stable sequela of arachnoiditis.  
4.  Medullary nephrocalcinosis. 
RADIATION DOSE REDUCTION: All CT scans are performed using radiation dose 
reduction techniques, when applicable.  Technical factors are evaluated and 
adjusted to ensure appropriate moderation of exposure.  Automated dose 
management technology is applied to adjust the radiation doses to minimize 
exposure while achieving diagnostic quality images.

## 2022-08-01 ENCOUNTER — Ambulatory Visit (INDEPENDENT_AMBULATORY_CARE_PROVIDER_SITE_OTHER): Payer: Medicare Other | Admitting: Family Medicine

## 2022-08-01 ENCOUNTER — Encounter (INDEPENDENT_AMBULATORY_CARE_PROVIDER_SITE_OTHER): Payer: Medicare Other

## 2022-08-01 ENCOUNTER — Encounter (INDEPENDENT_AMBULATORY_CARE_PROVIDER_SITE_OTHER): Payer: Self-pay

## 2022-08-01 VITALS — BP 108/55 | HR 94 | Temp 98.1°F | Resp 15

## 2022-08-01 DIAGNOSIS — R519 Headache, unspecified: Secondary | ICD-10-CM

## 2022-08-01 DIAGNOSIS — J019 Acute sinusitis, unspecified: Secondary | ICD-10-CM

## 2022-08-01 DIAGNOSIS — R6883 Chills (without fever): Secondary | ICD-10-CM

## 2022-08-01 DIAGNOSIS — B9789 Other viral agents as the cause of diseases classified elsewhere: Secondary | ICD-10-CM

## 2022-08-01 DIAGNOSIS — R5383 Other fatigue: Secondary | ICD-10-CM

## 2022-08-01 DIAGNOSIS — R0981 Nasal congestion: Secondary | ICD-10-CM

## 2022-08-01 DIAGNOSIS — J3489 Other specified disorders of nose and nasal sinuses: Secondary | ICD-10-CM

## 2022-08-01 DIAGNOSIS — Z1152 Encounter for screening for COVID-19: Secondary | ICD-10-CM

## 2022-08-01 LAB — RAPID INFLUENZA A&B NAAT (POCT)
Influenza A, Rapid: NOT DETECTED
Influenza B, Rapid: NOT DETECTED

## 2022-08-01 LAB — COVID-19 RAPID NAAT (POCT): COVID-19 Rapid Assay (POCT): NOT DETECTED

## 2022-08-01 LAB — RSV RAPID NAAT (POCT): RSV Viral RNA: NEGATIVE

## 2022-08-01 NOTE — Patient Instructions (Signed)
Flonase daily takes 3 days minimum to kick in want to use for 4 weeks.   Nasal saline rinses 1-3x a day   Anti histamine daily (claritin/zyrtec)     If you develop fever >100.4, or a single area of sinus that is worse pain than the rest or symptoms last longer than 10 days that is a reason for treatment with antibiotics.     Negative for flu, rsv, and covid.

## 2022-08-01 NOTE — Progress Notes (Signed)
Chief Complaint:   Chief Complaint   Patient presents with    Headache- New Onset Or New Symptoms    Sinus Problem     Sinus congestion and HA for 1 day.         Case summary is as follows: Alexa Price is a 72 year old female, PMH as below, with chief c/o sinus congestion for one day, has associated headache, rhinorrhea, fatigue, subjective fever and chills    Denies: nvd, ear pain, cough, sob, wheezing     Has tried: tylenol   Recent travel: [place] Contacts:  Denies    No hx of COVID infection in the past 90 days.      Review of Systems -  Review of Systems   Constitutional:  Positive for chills, fatigue and fever. Negative for unexpected weight change.   HENT:  Positive for congestion, rhinorrhea and sinus pressure. Negative for ear pain, hearing loss and sinus pain.    Respiratory:  Negative for cough and shortness of breath.    Cardiovascular:  Negative for chest pain.   Gastrointestinal:  Negative for diarrhea, nausea and vomiting.   Genitourinary:  Negative for dysuria.   Neurological:  Positive for headaches. Negative for weakness.            General:  WDWN, no distress  Head: NCAT  Eyes: PERRL. No conjunctival injection.  Ears:  External ears without erythema, edema, or otorrhea.  TMs NML.   Mouth:  MMM.  Posterior oropharynx  without tonsillar edema, lesions, or exudate.  Uvula is midline. No uvulitis.  Nose: Mild nasal congestion and rhinorrhea.  Neck:  Supple.  Non-tender. No lymphadenopathy.  Chest: Lungs CTAB, respirations are non-labored.  Occasional  cough is noted.  Heart:  RRR.  Normal S1S2.  No peripheral edema, no cyanosis.   Abdominal:  Non-distended  Extremities:  MAE x4   Neuro:  GCS 15. Normal mentation.   Skin: Warm, dry.          Results:    Results for orders placed or performed in visit on 08/01/22   Covid-19 Rapid NAAT (POCT)   Result Value Ref Range    COVID-19 Rapid Assay (POCT) Not Detected Not Detected   RSV Rapid NAAT (POCT)   Result Value Ref Range    RSV Viral RNA Negative  Negative   Rapid Influenza A&B NAAT (POCT)   Result Value Ref Range    Influenza A, Rapid Not Detected Not Detected    Influenza B, Rapid Not Detected Not Detected               ICD-10-CM ICD-9-CM    1. Viral sinusitis  J32.9 473.9     B97.89 079.99           Impression:     Viral sinusitis.     There are no adventitious breath sounds, pleuritic chest pain, tachypnea, tachycardia, hypoxia, dyspnea, chills or fevers or evidence to suggest pneumonia.  No stridor or increased work of breathing or evidence of epiglottitis.     Tympanic membranes appear normal with no evidence of acute otitis media.     No evidence of dehydration.    No meningeal signs, seizures, rashes, change in level of consciousness or evidence suggest systemic toxicity, sepsis, meningitis.     The patient does not exhibit any hypoxia or symptoms to suggest respiratory distress. The patient is nontoxic appearing and afebrile.  At this time the patient is medically stable and do  feel they are appropriate for ongoing outpatient care.      Negative for influenza A/B     Negative for Covid  Negative for RSV    Plan:    -PRN OTC medications, honey  -rest, hydration  -flonase, anti-histamine, nasal saline rinse.   -I discussed with patient/caregiver the nature of this patient's illness/problem, results of all resulted tests, course of treatment, and prospects for recovery/follow up care needs.   Parameters of returning to [facility] or ED also discussed with patient. The patient/caregiver understands to return immediately if the symptoms worsen or new symptoms develop.   Patient/caregiver verbalized understanding and agreement with plan and all questions answered.      Portions of this encounter were used with voice recognition software. Some errors, erroneous grammar and misrepresented words may be reflected in the dictation.

## 2022-08-01 NOTE — Interdisciplinary (Signed)
2 Patient Identifiers verified: Alexa Price [birth year]  POCT performed COVID  Specimen collected by Williemae Natter, LVN August 01, 2022, 10:43 AM   Patient tolerated well  Visual observation of POCT recorded in EMR for provider review and interpretation.

## 2022-10-08 IMAGING — CT CT GUIDED SI INJECTION
1 of 2 series · 14 of 32 positions shown, 19 images · non-contrast
Comparison: None.

________________________________________________________________________________________________ 
CT GUIDED SI INJECTION 10/08/2022 [DATE]: 
CLINICAL INDICATION: Lower back pain.
TECHNIQUE: The patient was placed prone on the CT table. The SI joint was 
imaged. The overlying skin was sterilely prepped and draped. 2% lidocaine was 
used for local anesthesia. A 22-gauge spinal needle was advanced into the 
posterior inferior aspect of left SI joint using CT guidance. Once 
intra-articular placement of the needle tip was obtained 3 mL of lidocaine and 1 
mL 0.5% Bupivacaine were injected. There were no immediate complications. Count 
of known CT and Cardiac Nuclear Medicine studies performed in the previous 12 
months = 2.

[Series 4: pre soft · axial · non-contrast · 0.45mm/px · z∈[-70,+11]mm · 14 of 91 slices shown, 19 images]
[im 5/91  soft-tissue]
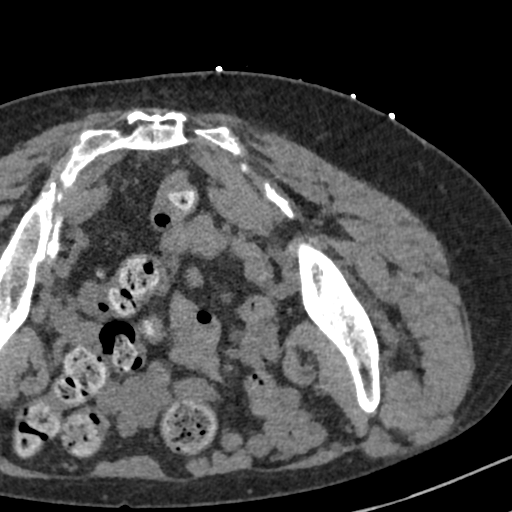
[im 5/91  bone]
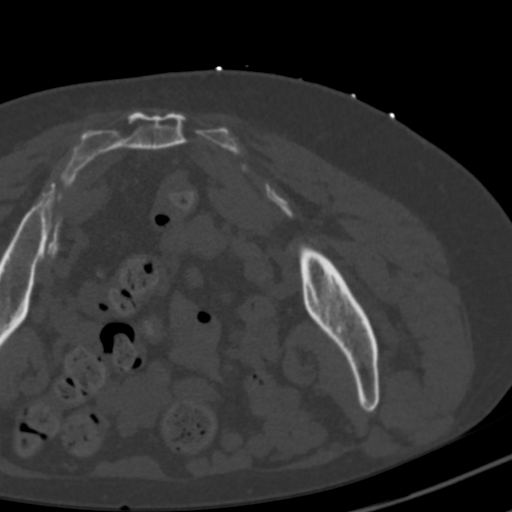
[im 13/91  soft-tissue]
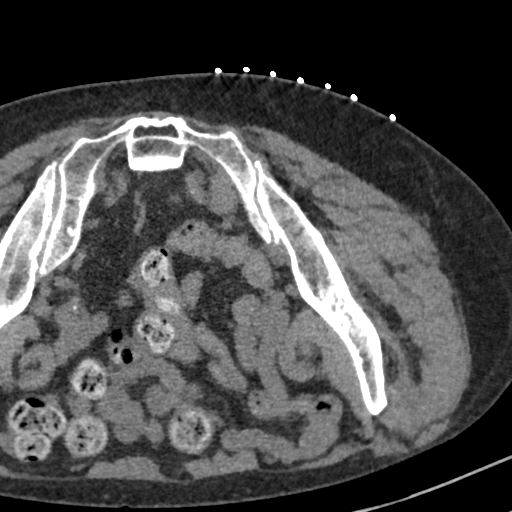
[im 18/91  soft-tissue]
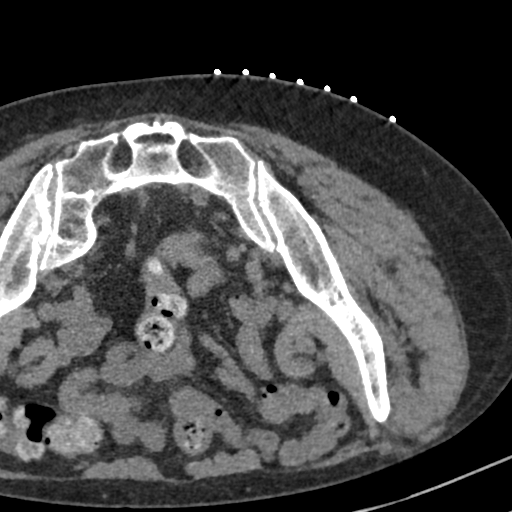
[im 26/91  soft-tissue]
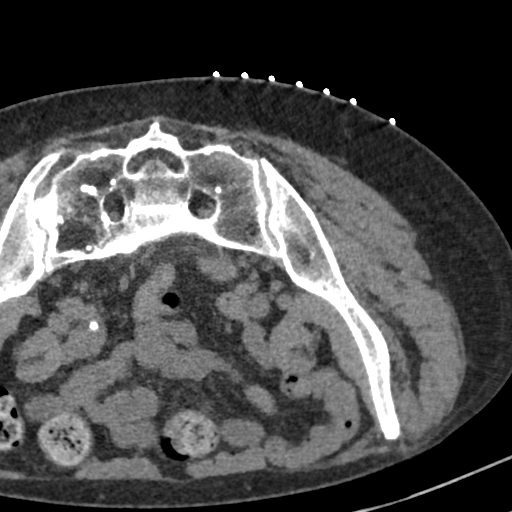
[im 31/91  soft-tissue]
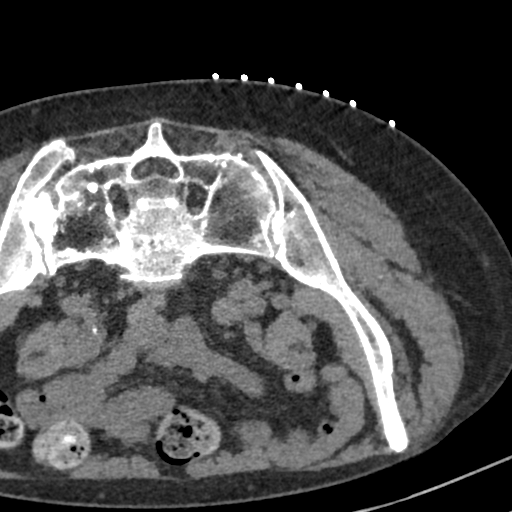
[im 39/91  soft-tissue]
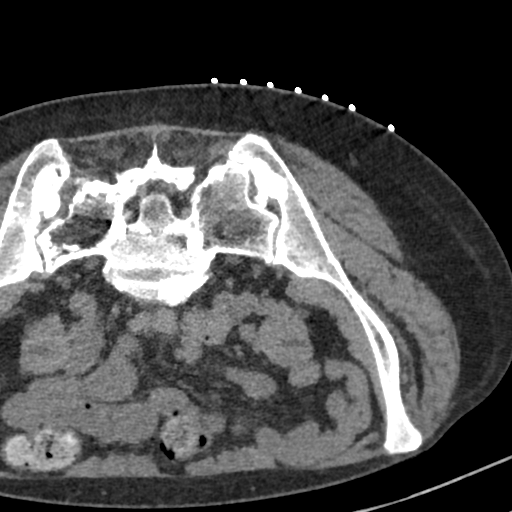
[im 48/91  soft-tissue]
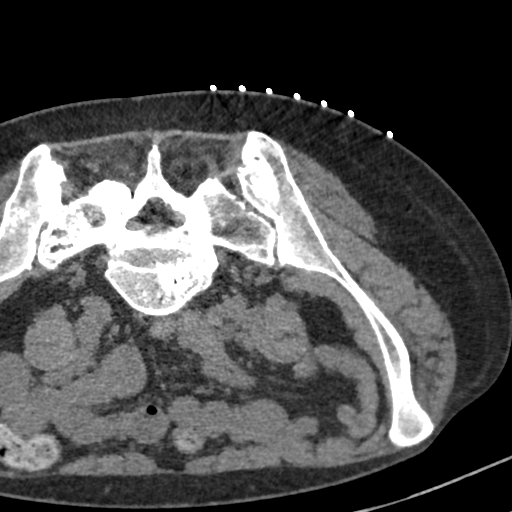
[im 52/91  soft-tissue]
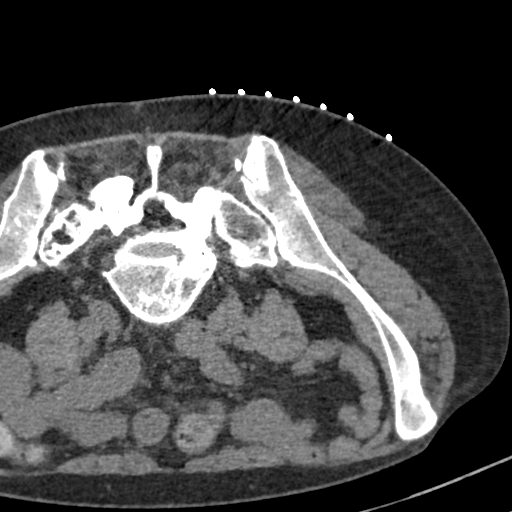
[im 61/91  soft-tissue]
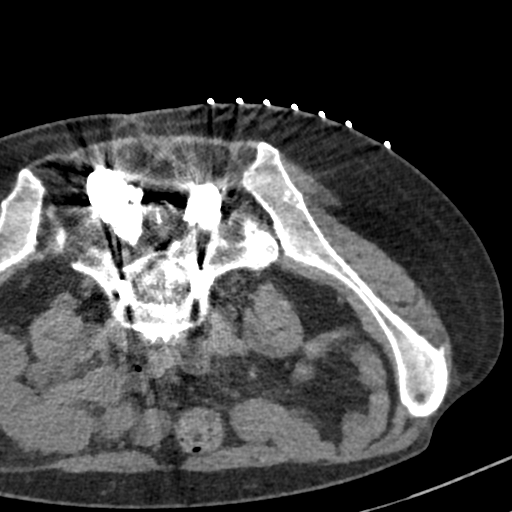
[im 61/91  bone]
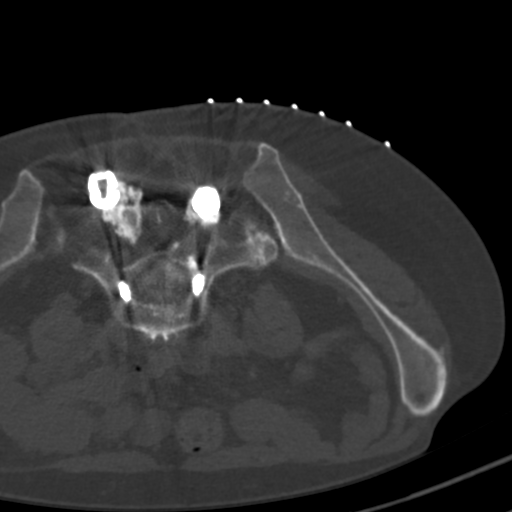
[im 65/91  soft-tissue]
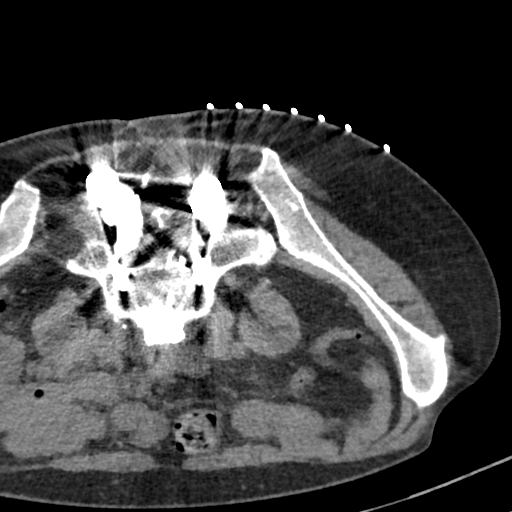
[im 73/91  soft-tissue]
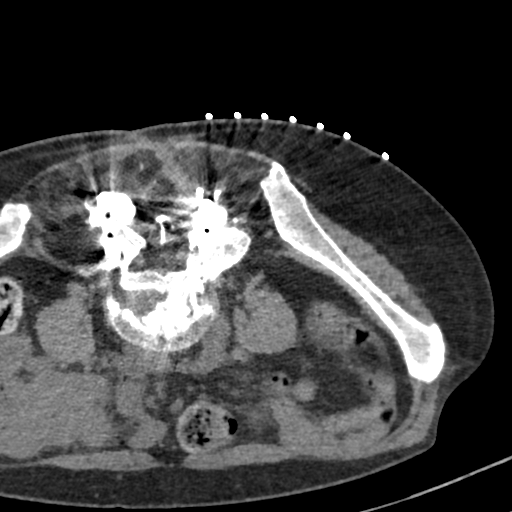
[im 73/91  lung]
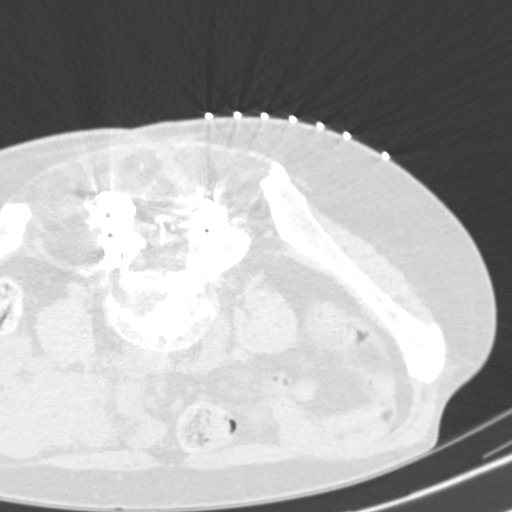
[im 78/91  soft-tissue]
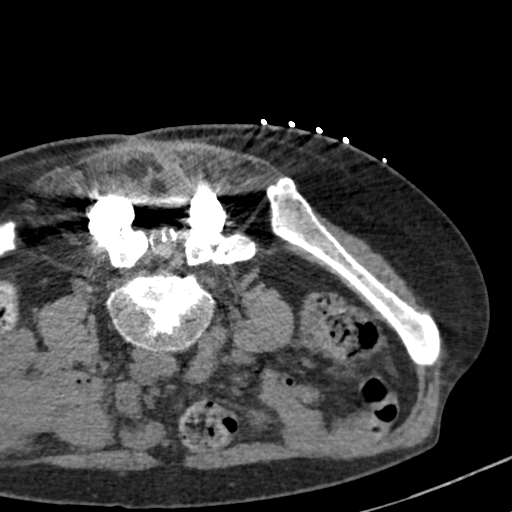
[im 78/91  lung]
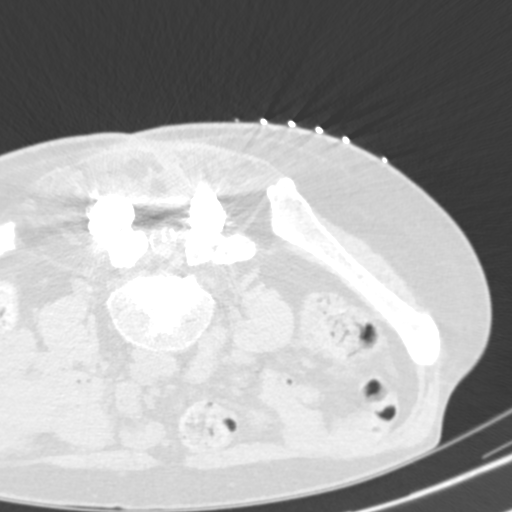
[im 82/91  lung]
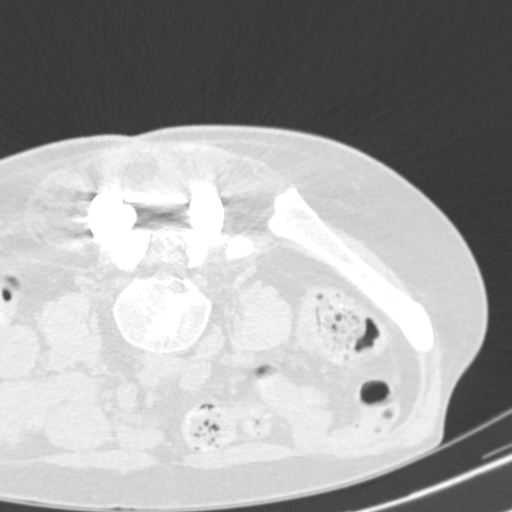
[im 86/91  soft-tissue]
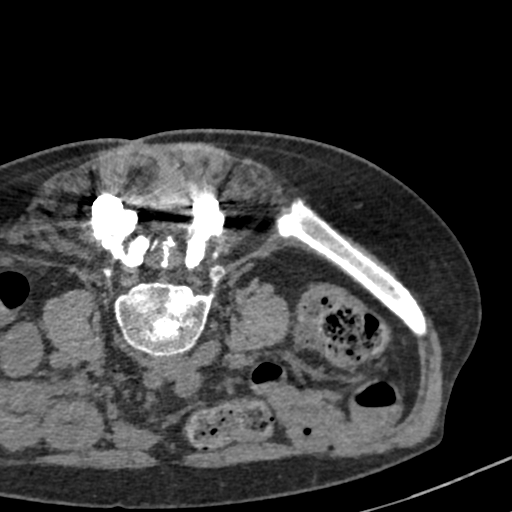
[im 86/91  lung]
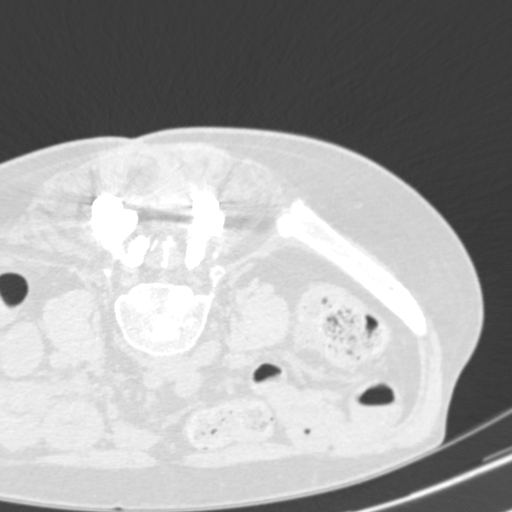

[14 of 32 positions shown; findings below may reference images not displayed]

IMPRESSION: Technically successful CT-guided SI joint injection. 
RADIATION DOSE REDUCTION: All CT scans are performed using radiation dose 
reduction techniques, when applicable.  Technical factors are evaluated and 
adjusted to ensure appropriate moderation of exposure.  Automated dose 
management technology is applied to adjust the radiation doses to minimize 
exposure while achieving diagnostic quality images.

## 2023-03-07 IMAGING — CT CT GUIDED SI INJECTION
1 series · 15 of 32 positions shown, 19 images · non-contrast
Comparison: CT lumbar spine June 19, 2022.

________________________________________________________________________________________________ 
******** ADDENDUM #1 ********/n 
CORRECTED BUPIVACAINE IN TECHNIQUE:  
Once intra-articular placement of the needle tip was obtained, a total of 20 mg 
of Bupivacaine MDV were injected into the right SI joint. 
CT GUIDED SI INJECTION 03/07/2023 [DATE]: 
CLINICAL INDICATION: Sacrococcygeal disorders, not elsewhere classified, Right 
SI Joint Pain ,SI Joint Pain , previous SI joint fusion September 2022.
TECHNIQUE: The patient was placed prone on the CT table. The SI joint was 
imaged. The overlying skin was sterilely prepped and draped. Ethyl chloride 
spray and 1% lidocaine subcutaneous was used for local anesthesia. A 22-gauge 
spinal needle was advanced into the posterior inferior aspect of right SI joint 
using CT guidance. Once intra-articular placement of the needle tip was 
obtained, 10 milligrams of Dexamethasone and 4 cc of 0.5% bupivacaine were 
injected. There were no immediate complications.

[Series 2: axial 1 · axial · 0.42mm/px · z∈[-148,-98]mm · 15 of 57 slices shown, 19 images]
[im 4/57  soft-tissue]
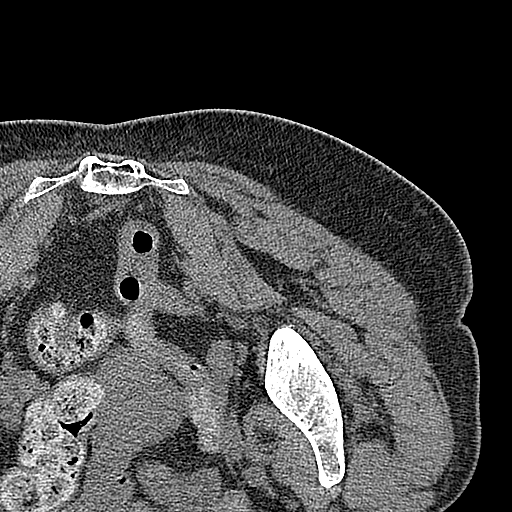
[im 4/57  bone]
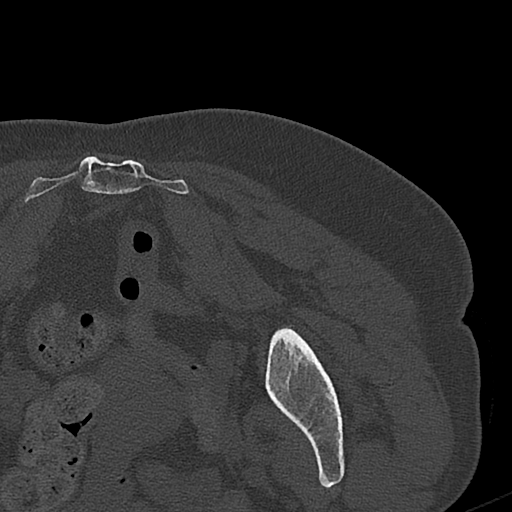
[im 8/57  soft-tissue]
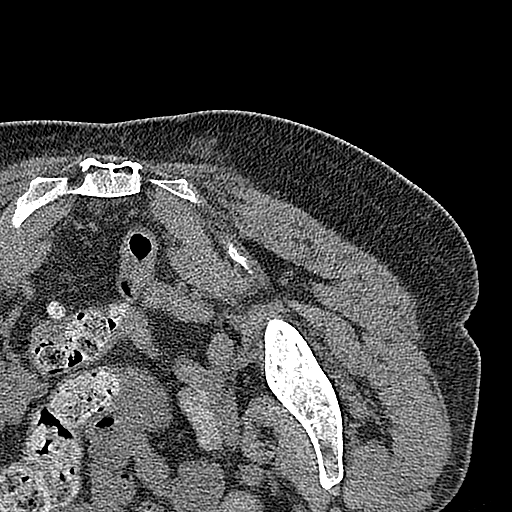
[im 11/57  soft-tissue]
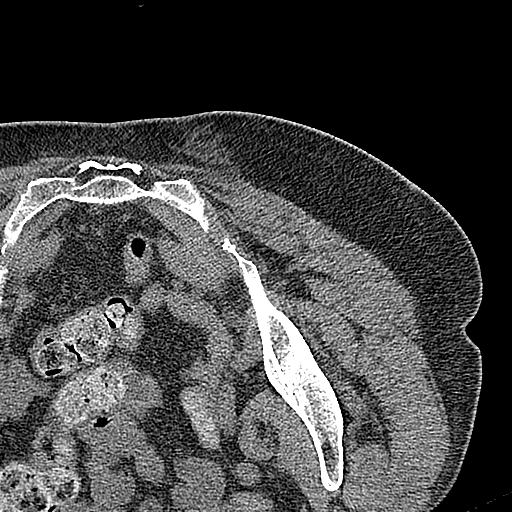
[im 17/57  soft-tissue]
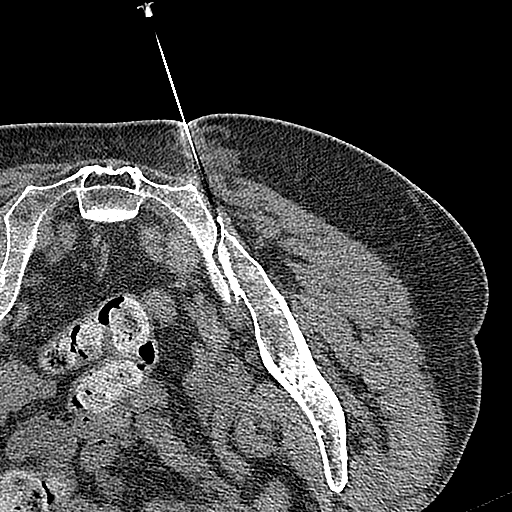
[im 20/57  soft-tissue]
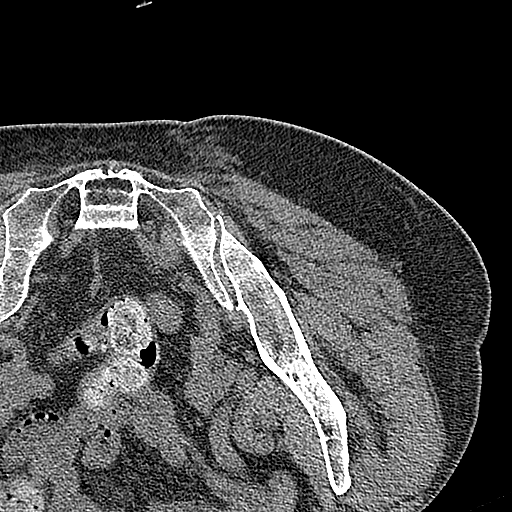
[im 24/57  soft-tissue]
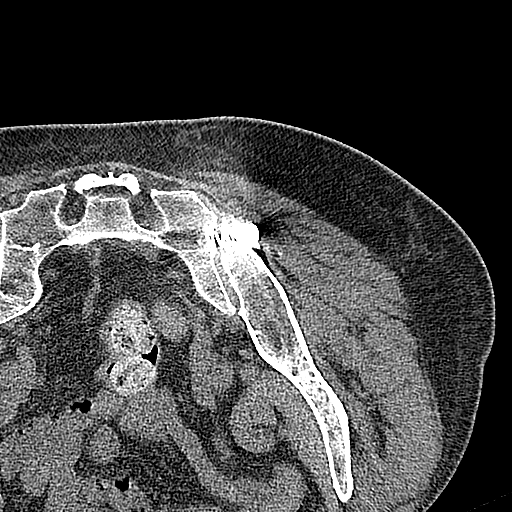
[im 29/57  soft-tissue]
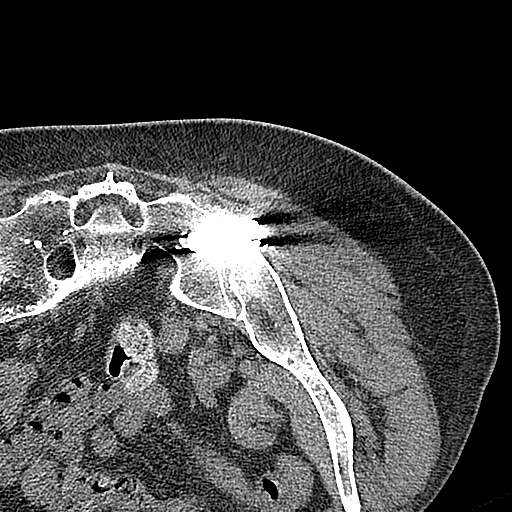
[im 33/57  soft-tissue]
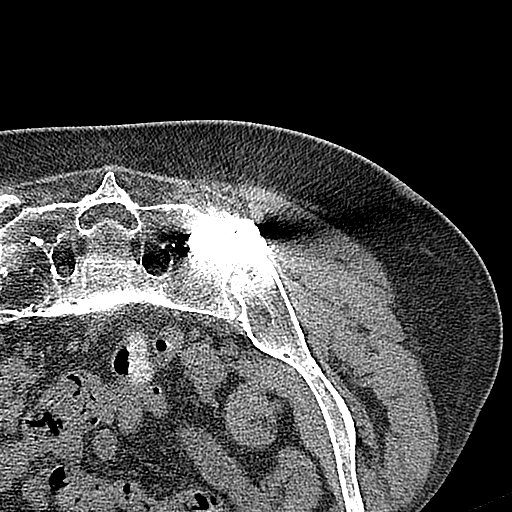
[im 37/57  soft-tissue]
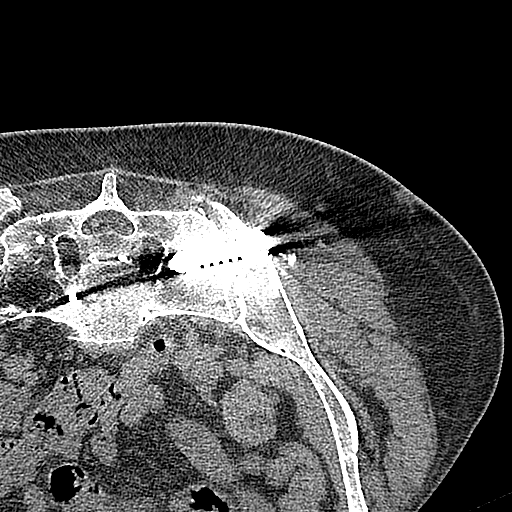
[im 37/57  bone]
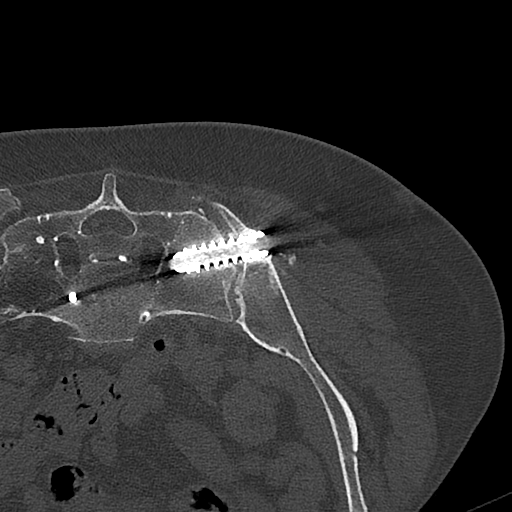
[im 40/57  soft-tissue]
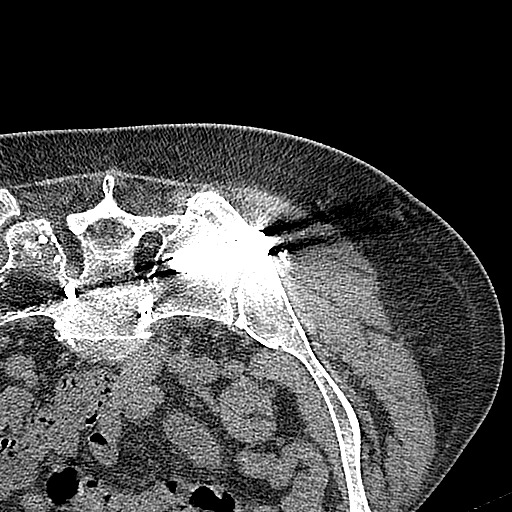
[im 46/57  soft-tissue]
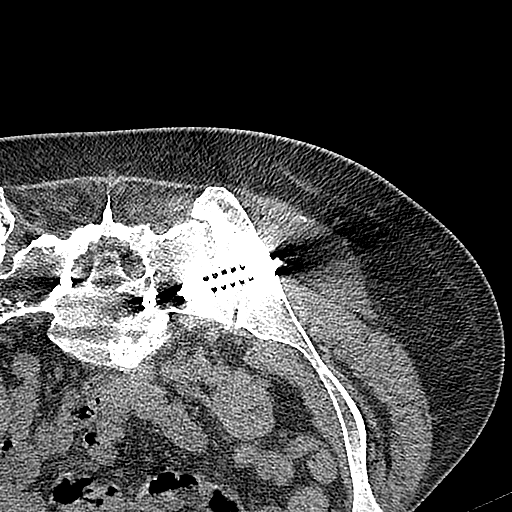
[im 49/57  soft-tissue]
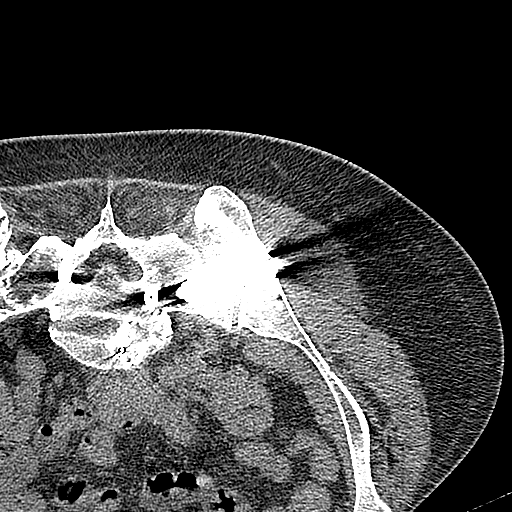
[im 49/57  lung]
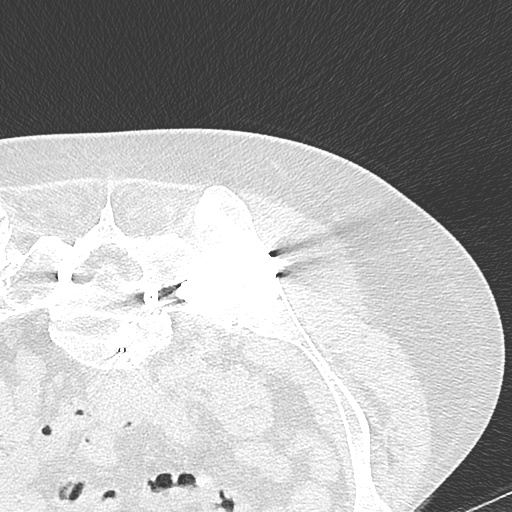
[im 51/57  lung]
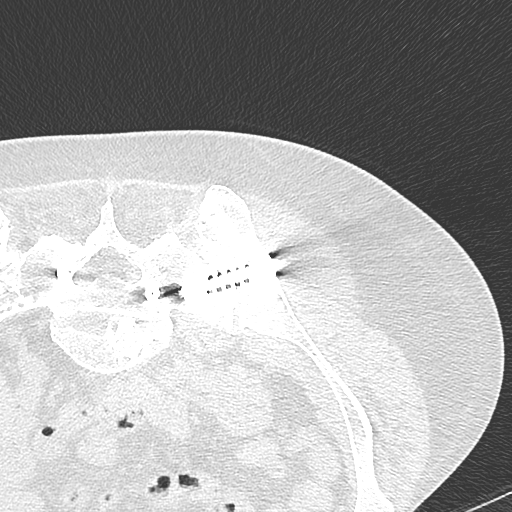
[im 53/57  soft-tissue]
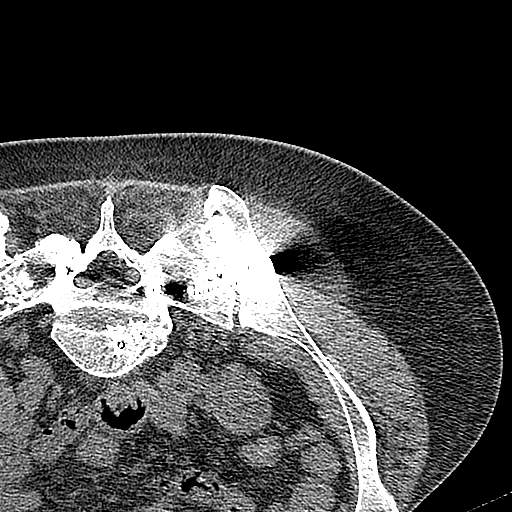
[im 53/57  lung]
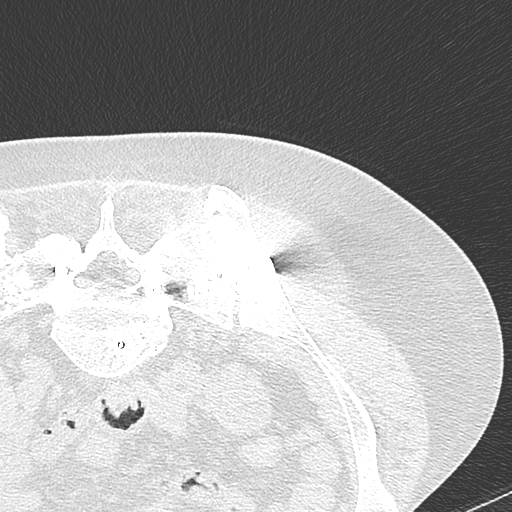
[im 55/57  lung]
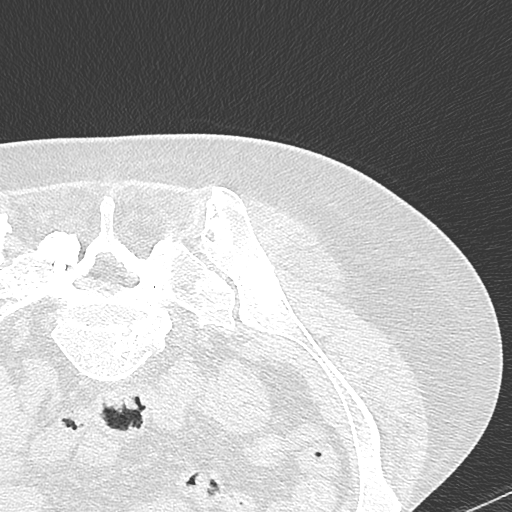

[15 of 32 positions shown; findings below may reference images not displayed]

Count of known CT and Cardiac Nuclear Medicine studies performed in the previous 
12 months = 2.
IMPRESSION: Technically successful CT-guided SI joint injection. 
Procedure pain 8, postprocedure pain 0. 
RADIATION DOSE REDUCTION: All CT scans are performed using radiation dose 
reduction techniques, when applicable.  Technical factors are evaluated and 
adjusted to ensure appropriate moderation of exposure.  Automated dose 
management technology is applied to adjust the radiation doses to minimize 
exposure while achieving diagnostic quality images.

## 2023-03-07 IMAGING — CT CT PELVIS WITHOUT CONTRAST
2 of 4 series · 15 of 46 positions shown, 17 images · non-contrast
Comparison: Exams dating back to radiograph of the pelvis of 07/19/2007.

________________________________________________________________________________________________ 
CT PELVIS WITHOUT CONTRAST, 03/07/2023 [DATE]: 
CLINICAL INDICATION: History of bilateral SI joint fusion in September 2022. Right 
SI joint pain. Left hip pain. 
A search for DICOM formatted images was conducted for prior CT imaging studies 
completed at a non-affiliated media free facility.
TECHNIQUE: The pelvis was scanned from lower abdomen though the pubic rami 
without contrast on a high-resolution CT scanner using dose reduction 
techniques. Routine MPR reconstructions were performed.

[Series 2: axial · axial · 0.66mm/px · z∈[-291,-65]mm · 12 of 175 slices shown, 14 images]
[im 12/175  soft-tissue]
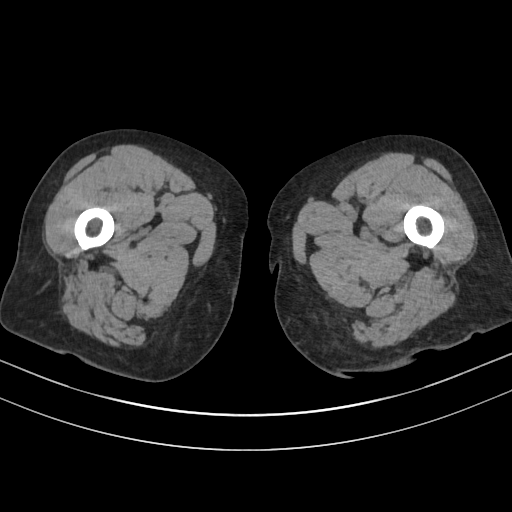
[im 12/175  bone]
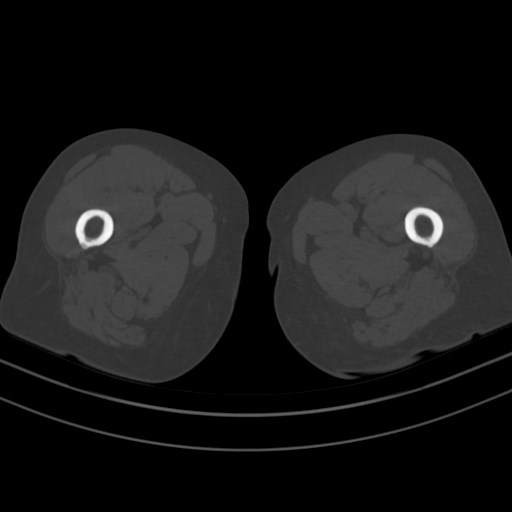
[im 24/175  soft-tissue]
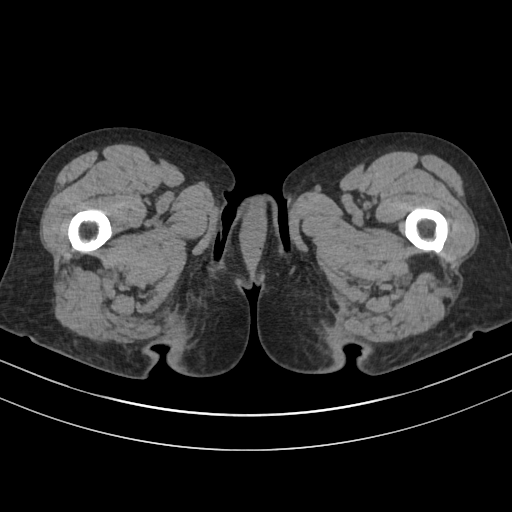
[im 35/175  soft-tissue]
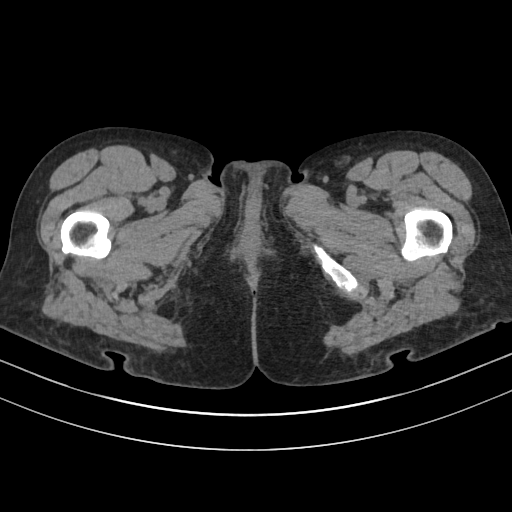
[im 59/175  soft-tissue]
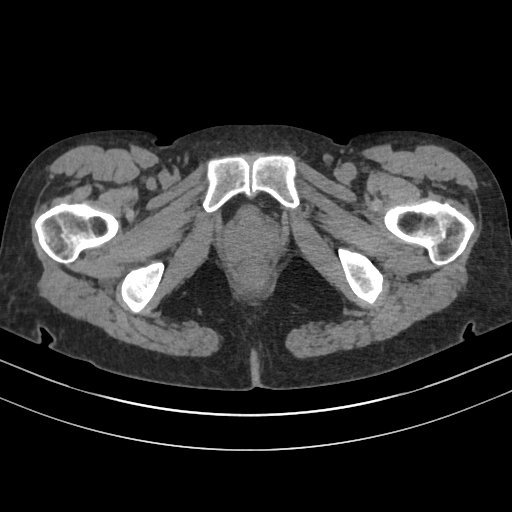
[im 70/175  soft-tissue]
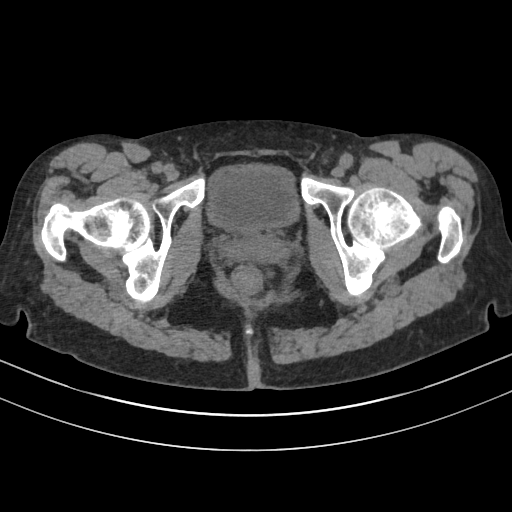
[im 82/175  soft-tissue]
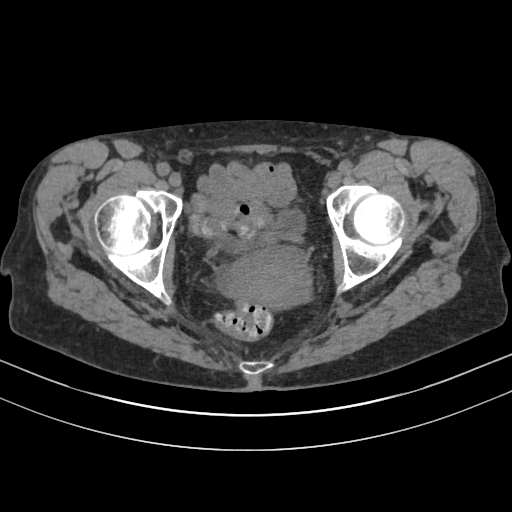
[im 93/175  soft-tissue]
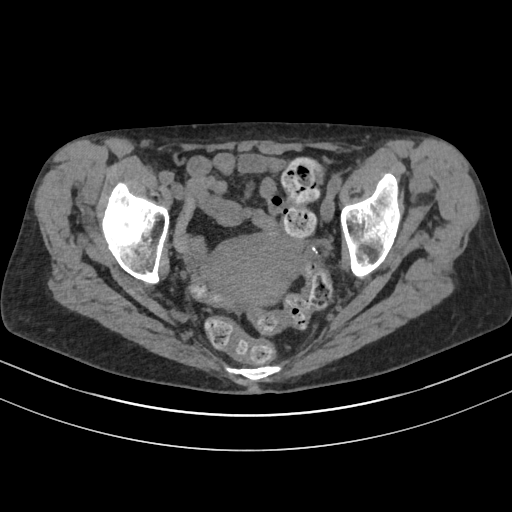
[im 105/175  soft-tissue]
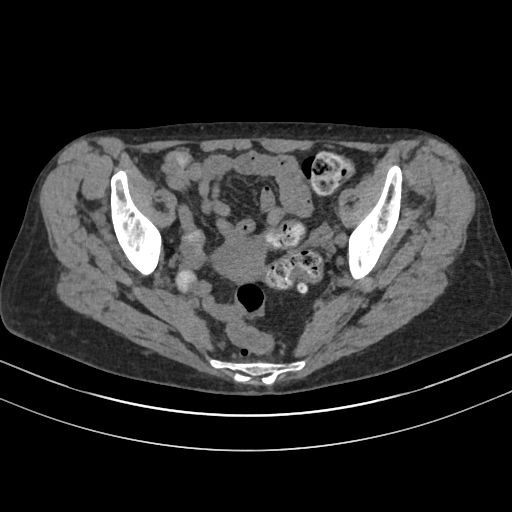
[im 117/175  soft-tissue]
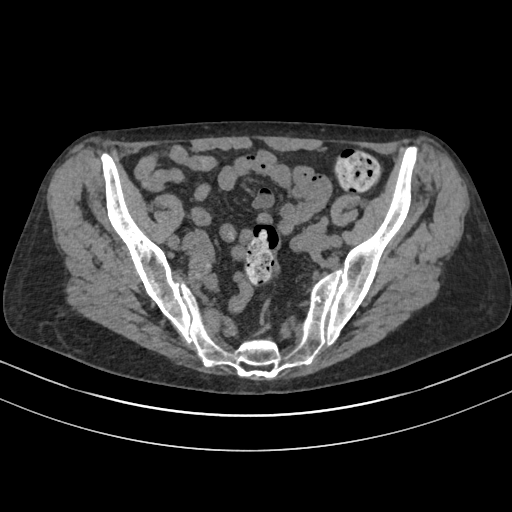
[im 117/175  bone]
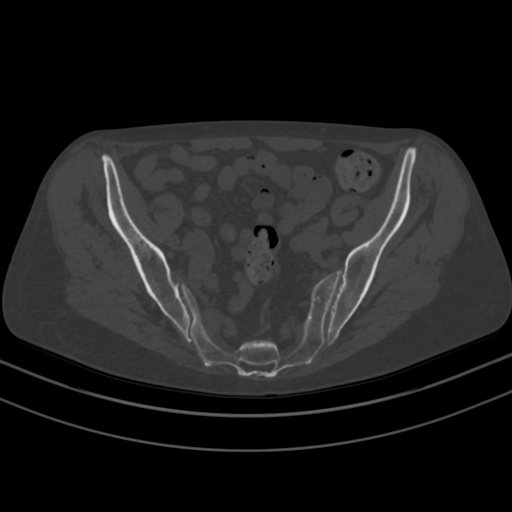
[im 140/175  soft-tissue]
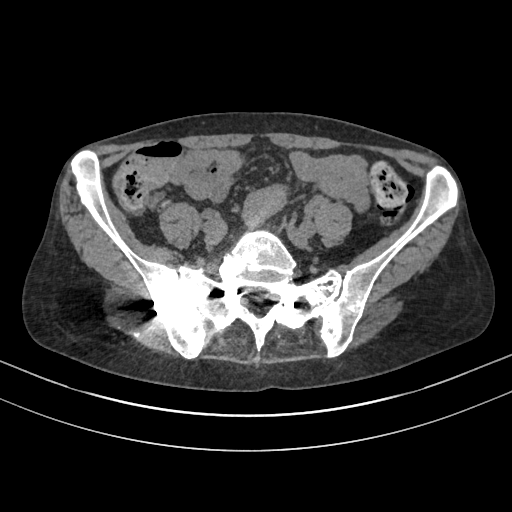
[im 151/175  soft-tissue]
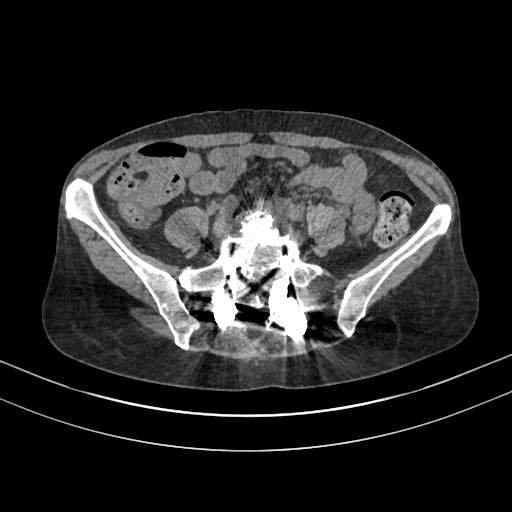
[im 163/175  soft-tissue]
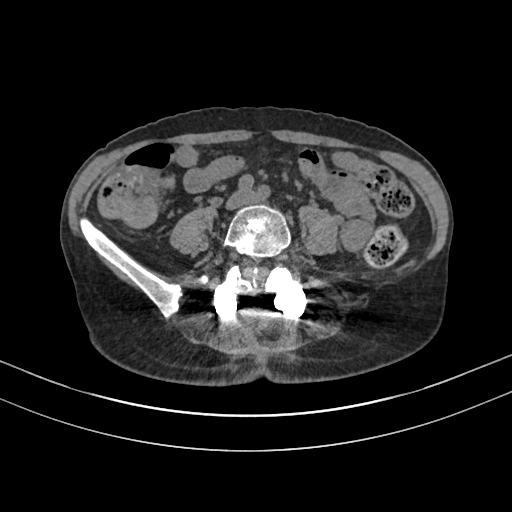

[Series 4: hip cor · coronal · 0.57mm/px · 3 of 98 slices shown]
[im 33/98  soft-tissue]
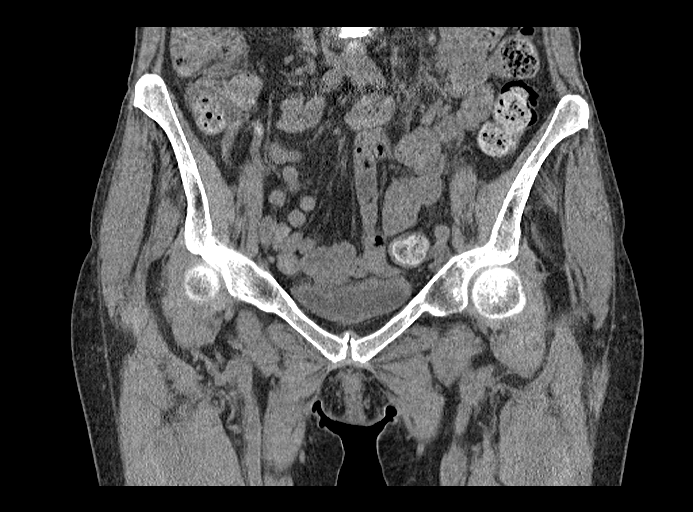
[im 44/98  soft-tissue]
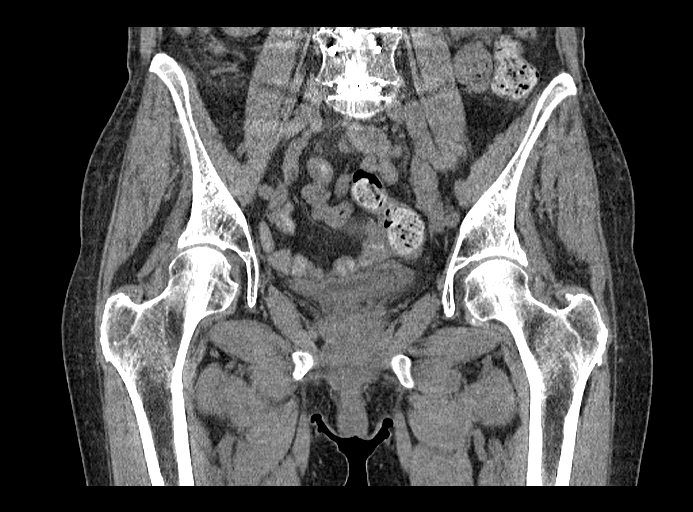
[im 54/98  soft-tissue]
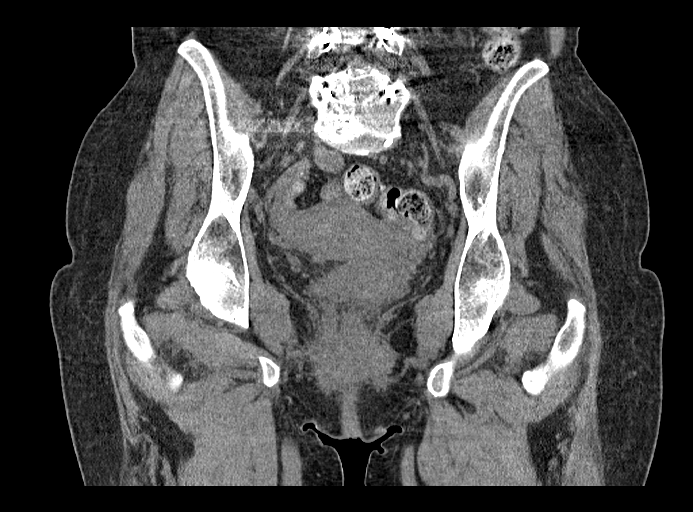

[15 of 46 positions shown; findings below may reference images not displayed]

Count of known CT and Cardiac Nuclear Medicine studies performed in the previous 
12 months = 1.
FINDINGS: SPINE transitional sacral segment. Postsurgical changes included lumbosacral 
spine with transpedicular screw and rod fixation and interbody spacers.  
SI JOINTS: Degenerative changes bilateral SI joints with screw fixation on the 
right. Hardware is intact without lucency to suggest mechanical loosening or 
osteolysis.  
HIPS: Hip joints are preserved. Both femoral heads maintain a spherical 
configuration without evidence of avascular necrosis or subarticular collapse. 
No abnormal morphology of the proximal femurs or acetabulum to predispose to 
impingement. 
BONES: No fracture or marrow replacing lesion. Bilateral posteromedial iliac 
donor defects are again seen. Stable left sacral alar lucency with adjacent 
trabecular coarsening, again likely hemangioma. Osteopenia. 
PUBIC SYMPHYSIS: Mild degenerative change. 
BOWEL: Colonic diverticula. Normal wall thickness, no obstruction. 
BLADDER: Normal configuration and wall thickness. 
PELVIC ORGANS: Normal. 
PERITONEAL CAVITY: No fluid collection. 
LYMPH NODES: No adenopathy. 
ILIOFEMORAL VESSELS: No aneurysm.
IMPRESSION: 1.  Postsurgical changes included lumbosacral spine and right SI joint. No 
hardware failure delineated. 
2.  Scattered degenerative changes. 
3.  Bilateral posteromedial iliac donor defects.  
4.  Sacral hemangioma.  
RADIATION DOSE REDUCTION: All CT scans are performed using radiation dose 
reduction techniques, when applicable.  Technical factors are evaluated and 
adjusted to ensure appropriate moderation of exposure.  Automated dose 
management technology is applied to adjust the radiation doses to minimize 
exposure while achieving diagnostic quality images.

## 2023-03-08 IMAGING — MR MRI PELVIS WITHOUT CONTRAST
4 of 9 series · 14 of 48 positions shown · IV contrast (gadolinium)
Comparison: 01/10/2021 CT pelvis and 05/01/2022 MRI right hip

________________________________________________________________________________________________ 
MRI PELVIS WITHOUT CONTRAST, 03/08/2023 [DATE]: 
CLINICAL INDICATION: Age-related Osteoporosis Without Current Pathological 
Fracture. Other Polyosteoarthritis. Pain In Unspecified Hip.
TECHNIQUE: Multiplanar, multiecho position MR images of the pelvis were 
performed without intravenous gadolinium enhancement.

[Series 101: survey · axial · 10.0mm · 1.25mm/px · z∈[-34,+200]mm · 2 of 11 slices shown]
[im 1/11]
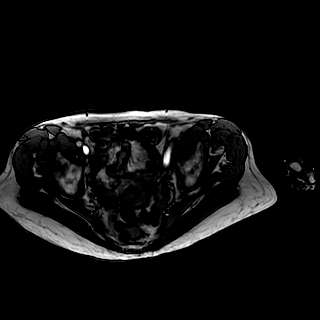
[im 11/11]
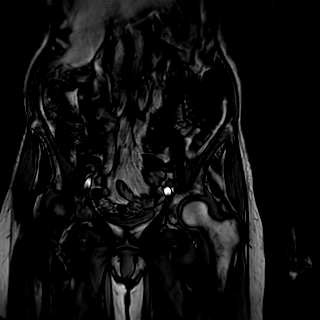

[Series 201: t1_(person_name) · axial · 6.0mm · 0.44mm/px · z∈[-169,+79]mm · 5 of 32 slices shown]
[im 1/32]
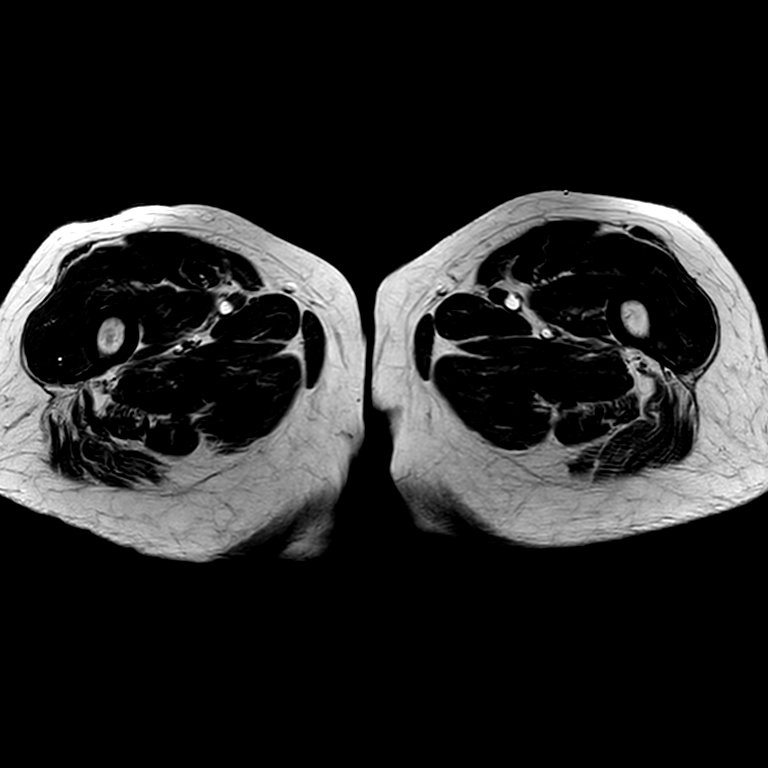
[im 8/32]
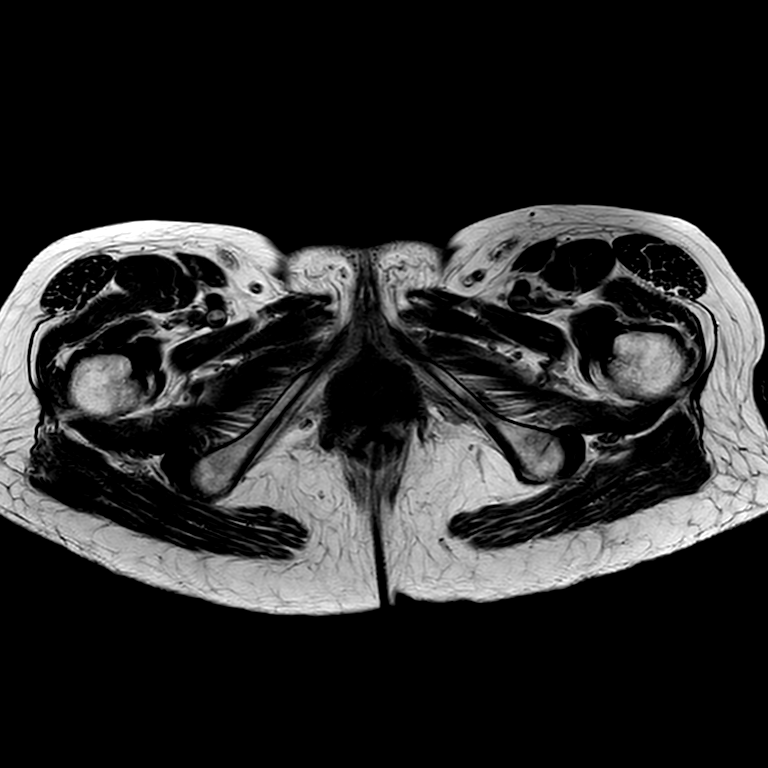
[im 16/32]
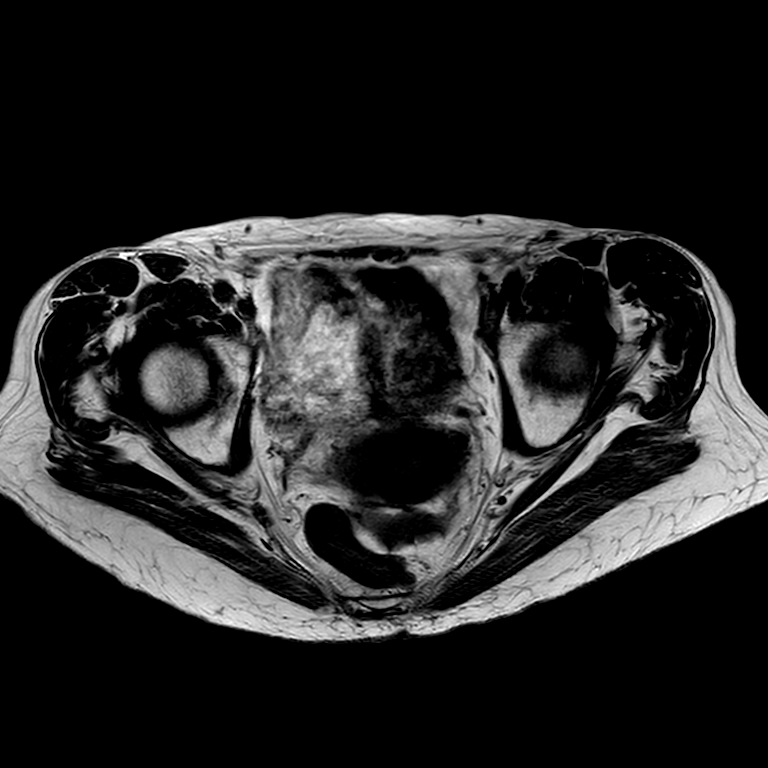
[im 24/32]
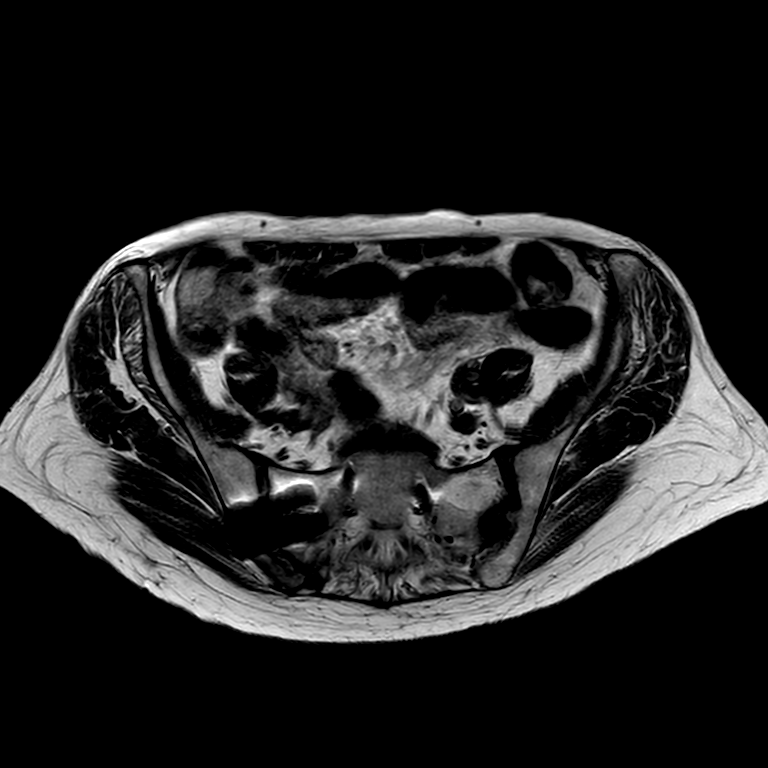
[im 32/32]
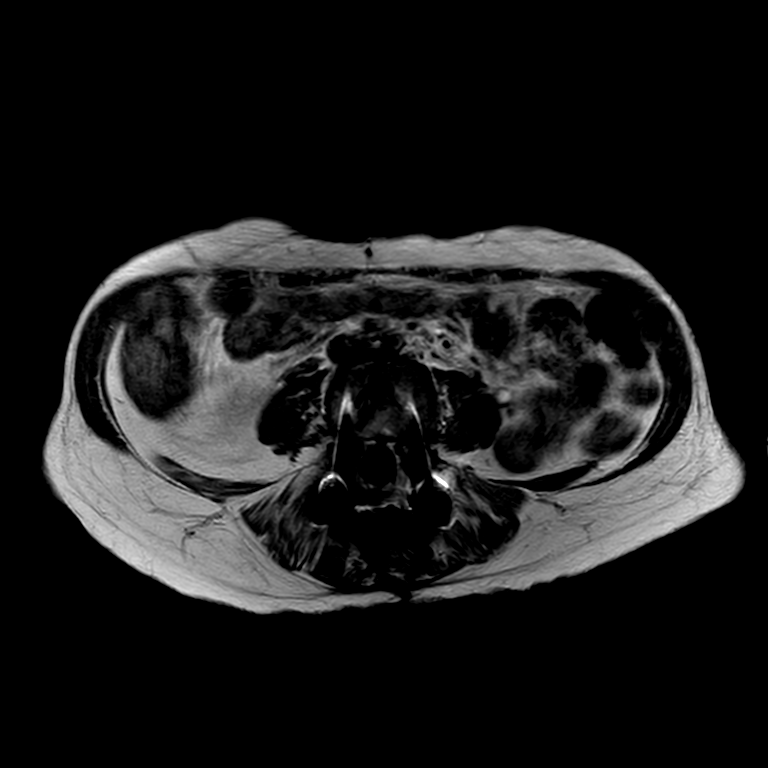

[Series 302: DIXON fat-sat · axial · 6.0mm · 0.65mm/px · z∈[-169,+79]mm · 4 of 32 slices shown]
[im 1/32]
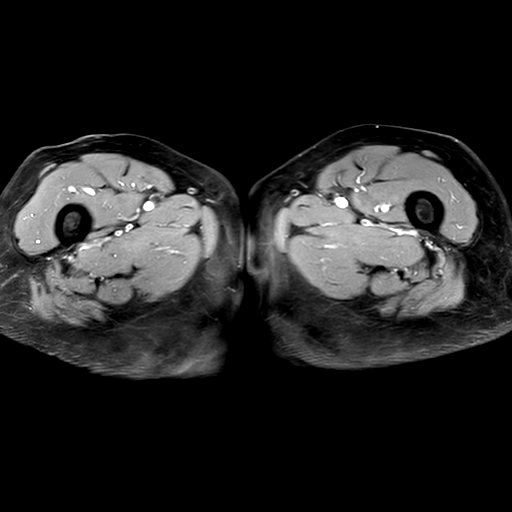
[im 8/32]
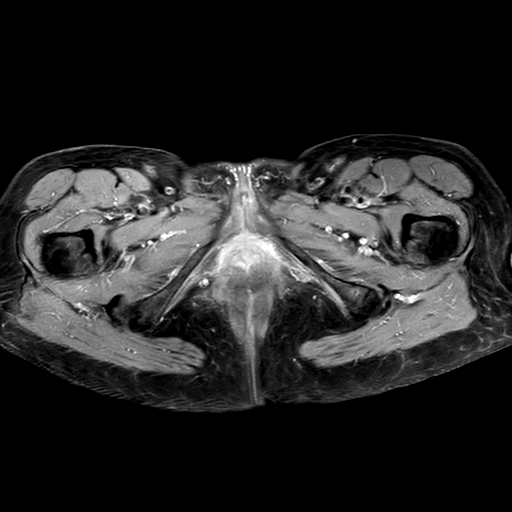
[im 16/32]
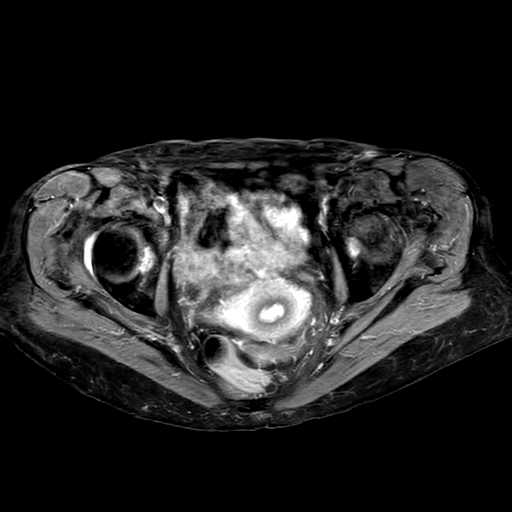
[im 32/32]
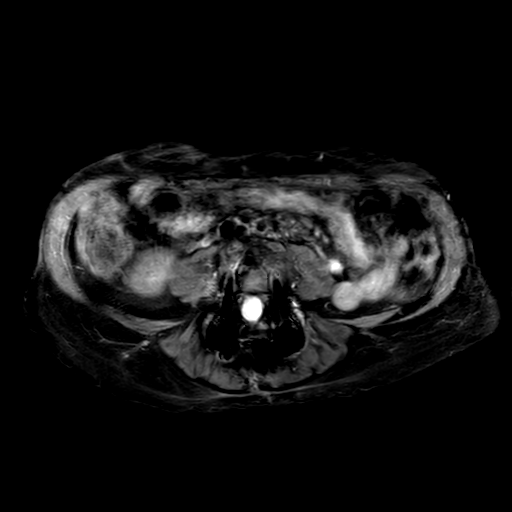

[Series 303: DIXON · axial · 6.0mm · 0.65mm/px · z∈[-169,+79]mm · 3 of 32 slices shown]
[im 1/32]
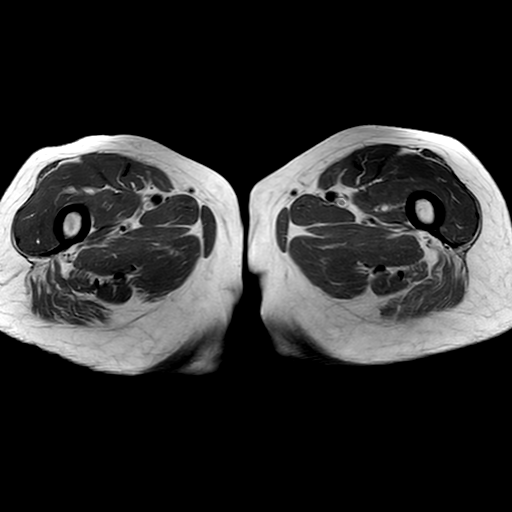
[im 16/32]
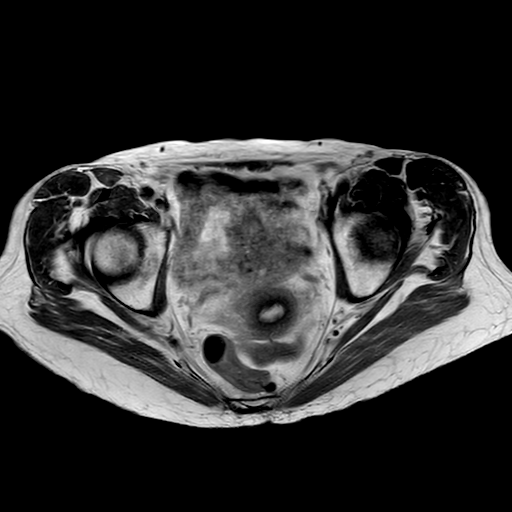
[im 32/32]
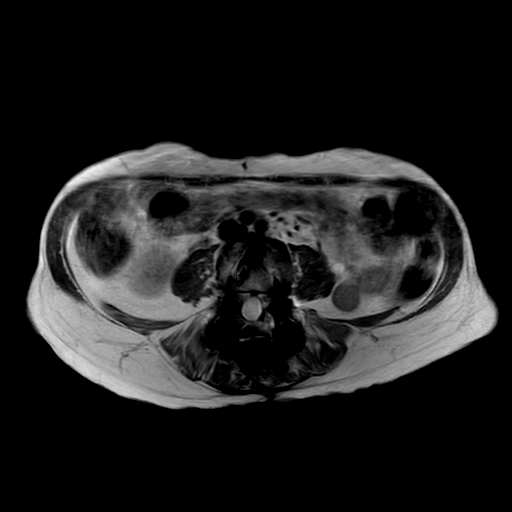

[14 of 48 positions shown; findings below may reference images not displayed]

FINDINGS: HIPS: Mild degenerative change of the hips with partial-thickness 
chondromalacia. No labral tear. No paralabral cyst. Small amount of right hip 
joint fluid. Both femoral heads maintain a spherical configuration without 
evidence of avascular necrosis or subarticular collapse. No abnormal morphology 
of the proximal femurs or acetabulum to predispose to impingement. 
PELVIC BONES: Stable small foci of posteromedial iliac osteonecrosis. No 
fracture, contusion or marrow replacing lesion.  
SI JOINTS: Interval right SI joint arthrodesis. Mild degenerative change of the 
SI joints. 
PUBIC SYMPHYSIS: Mild degenerative change.  
SPINE: Transitional lumbosacral segment. Lumbosacral posterior decompression, 
pedicle screw fixation and intervertebral disc spacers. Left sacral alar 
hemangioma. 
SOFT TISSUES: Mild tendinosis of the bilateral distal gluteus minimus tendons 
with tendon thickening, intermediate signal and mild peritendinous edema. The 
abductor cuffs are otherwise preserved without high-grade interstitial tear. 
There is trace fluid overlying the greater trochanters without overt 
trochanteric bursitis. The origins of the hamstrings are intact. The rectus 
abdominis-adductor aponeurotic complexes are intact. No mass, free fluid or 
adenopathy. Colonic diverticulosis. 1.2 cm uterine fibroid.
IMPRESSION: 1.  Interval right SI joint arthrodesis. Mild degenerative change of the SI 
joints.  
2.  Mild degenerative change of the hips.  
3.  Small foci of posteromedial iliac osteonecrosis.  
4.  Lumbosacral posterior decompression, pedicle screw fixation and 
intervertebral disc spacers.  
5.  1.2 cm uterine fibroid.  
6.  Colonic diverticulosis.

## 2023-08-05 IMAGING — CT CT GUIDED SI INJECTION
1 of 2 series · 15 of 32 positions shown, 19 images · non-contrast
Comparison: None.

________________________________________________________________________________________________ 
CT GUIDED SI INJECTION 08/05/2023 [DATE]: 
CLINICAL INDICATION: Sacroiliitis, Not Elsewhere Classified
TECHNIQUE: The patient was placed prone on the CT table. The SI joint was 
imaged. The overlying skin was sterilely prepped and draped. 1% local lidocaine 
was used for local anesthesia. A 22-gauge spinal needle was advanced into the 
posterior inferior aspect of right SI joint using CT guidance. Once 
intra-articular placement of the needle tip was obtained, 10 milligrams of 
Dexamethasone and 15 mg of Bupivacaine were injected. 35 mg of Bupivacaine 
discarded. There were no immediate complications.

[Series 2: pre bone rt · axial · non-contrast · 0.41mm/px · z∈[-105,-24]mm · 15 of 91 slices shown, 19 images]
[im 5/91  soft-tissue]
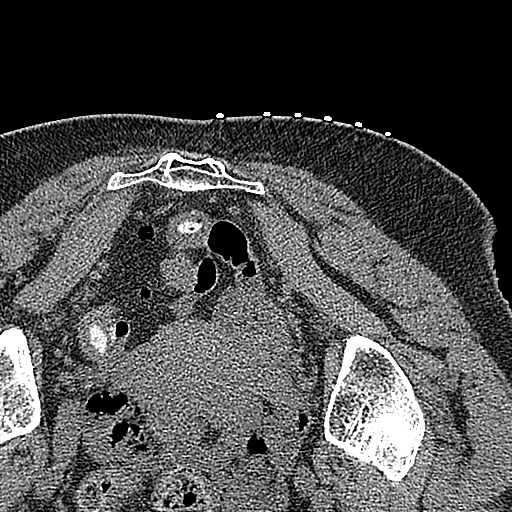
[im 5/91  bone]
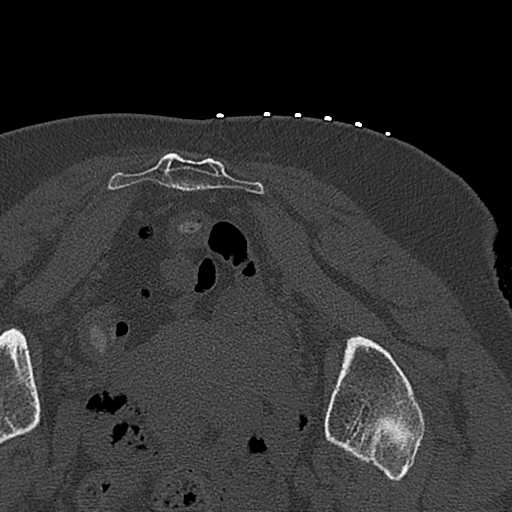
[im 13/91  soft-tissue]
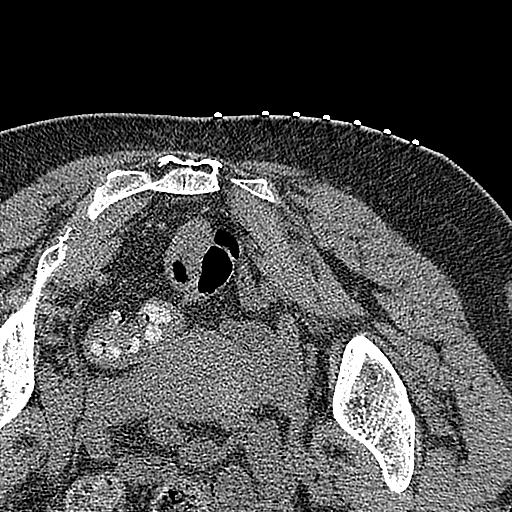
[im 21/91  soft-tissue]
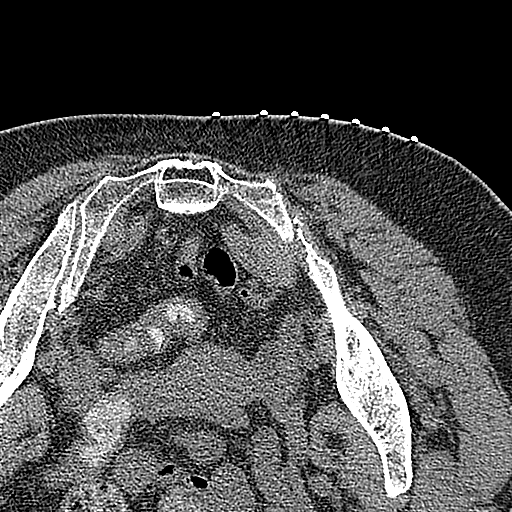
[im 25/91  soft-tissue]
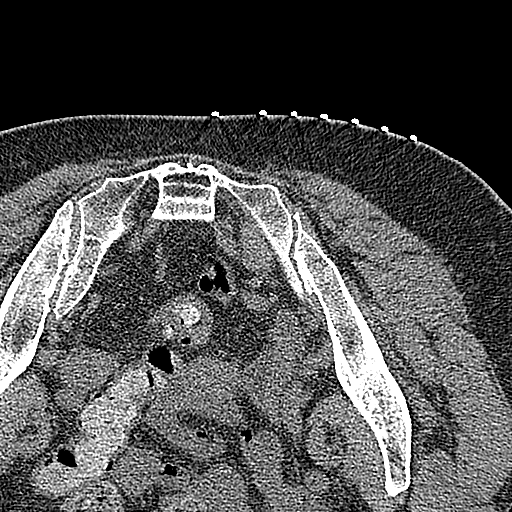
[im 33/91  soft-tissue]
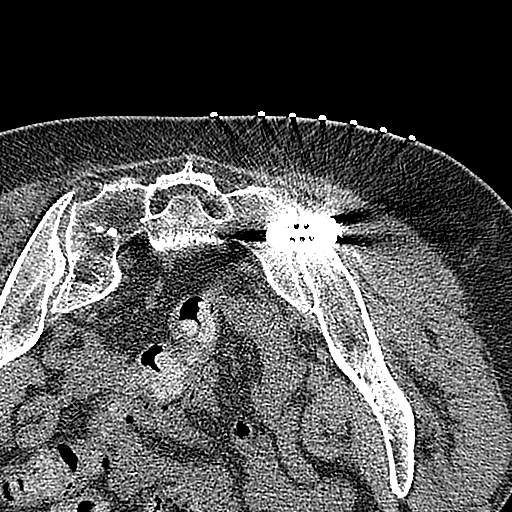
[im 37/91  soft-tissue]
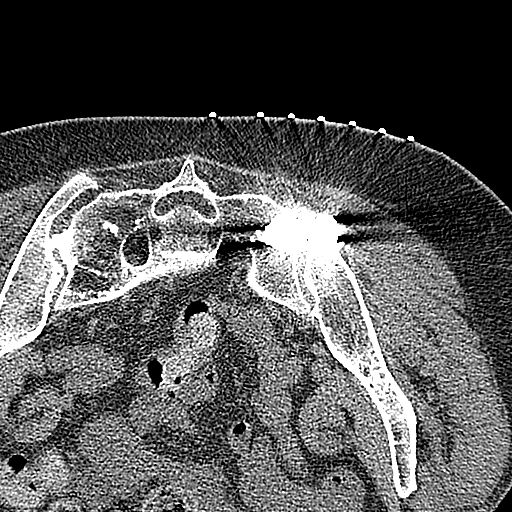
[im 46/91  soft-tissue]
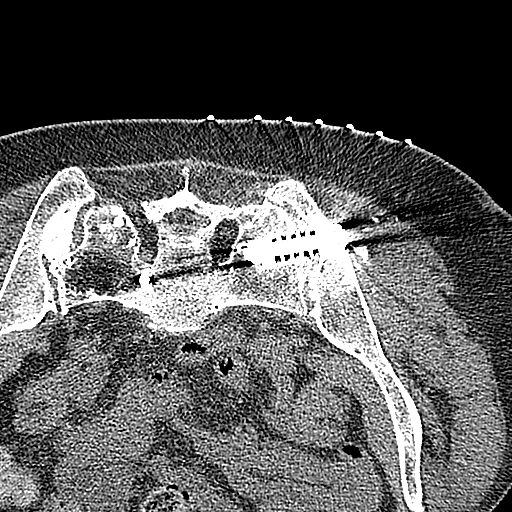
[im 54/91  soft-tissue]
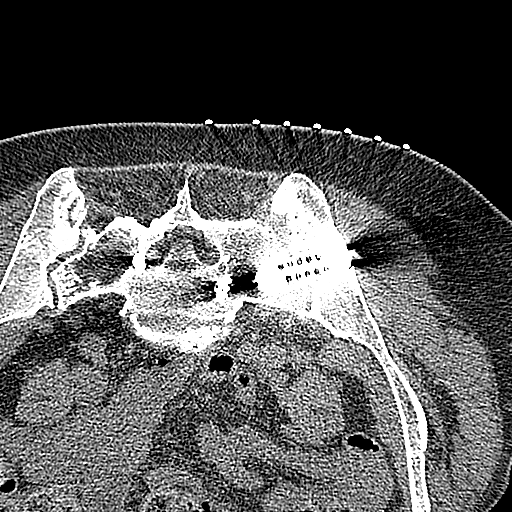
[im 58/91  soft-tissue]
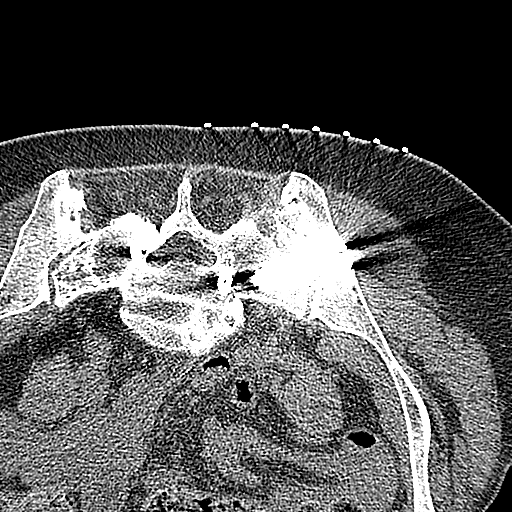
[im 58/91  bone]
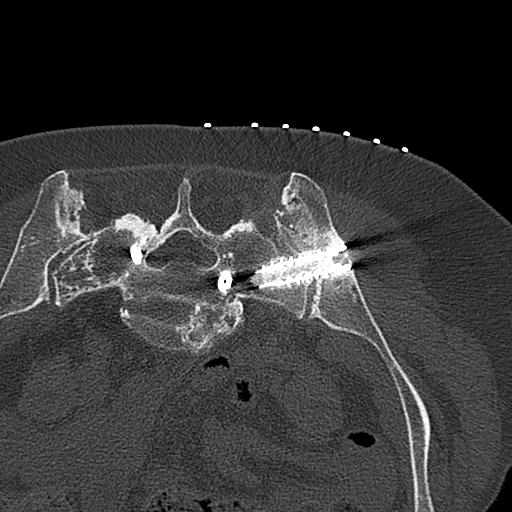
[im 66/91  soft-tissue]
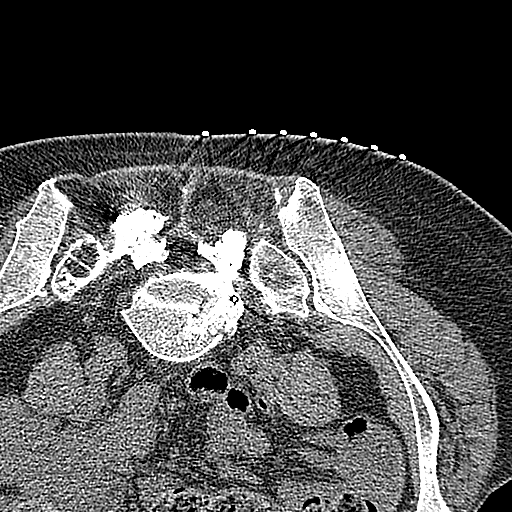
[im 70/91  soft-tissue]
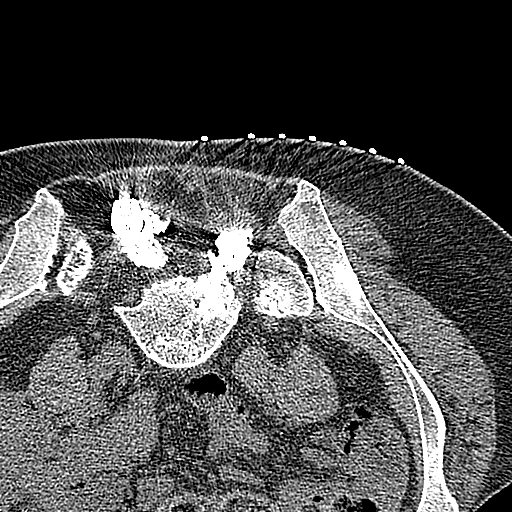
[im 74/91  lung]
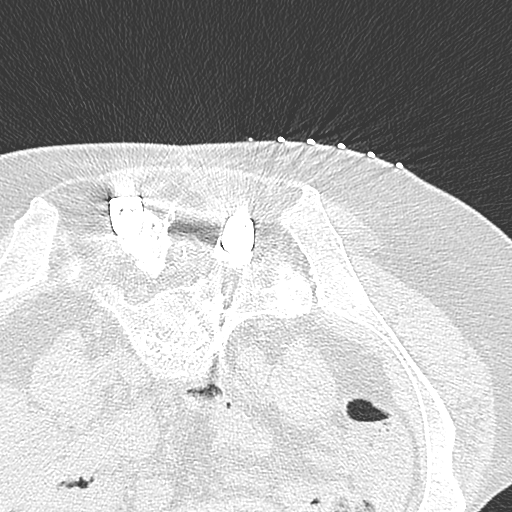
[im 78/91  soft-tissue]
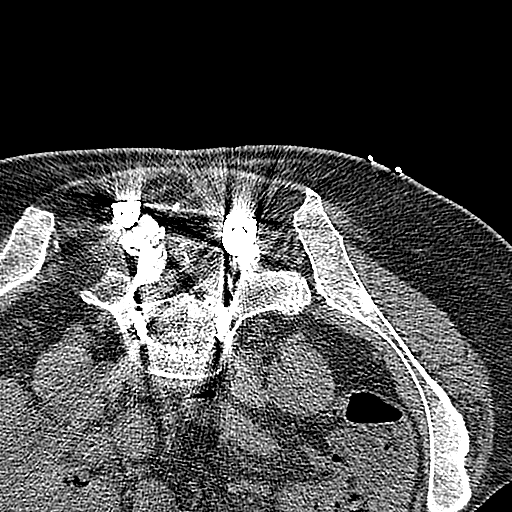
[im 78/91  lung]
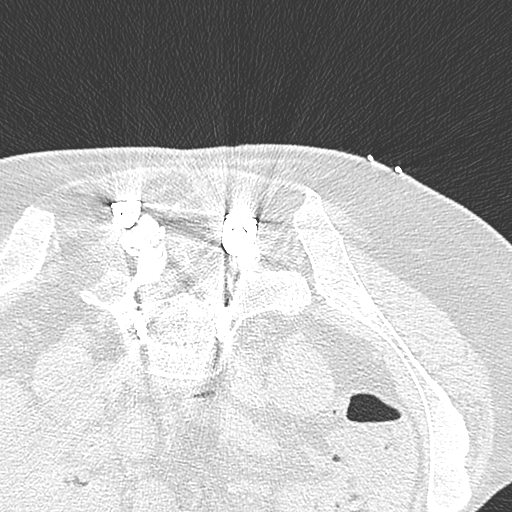
[im 82/91  lung]
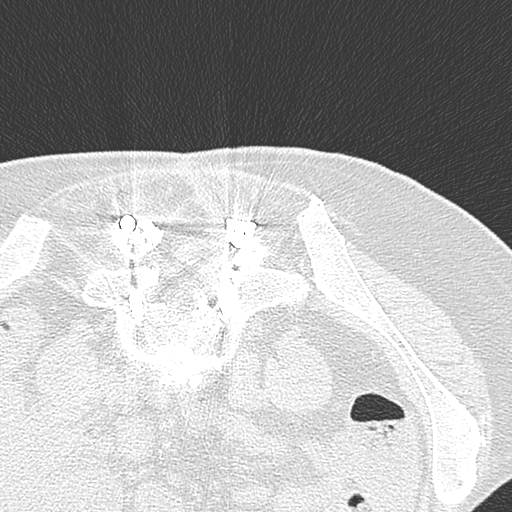
[im 86/91  soft-tissue]
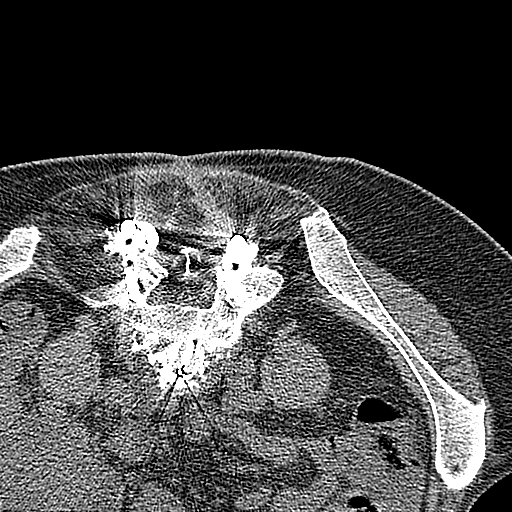
[im 86/91  lung]
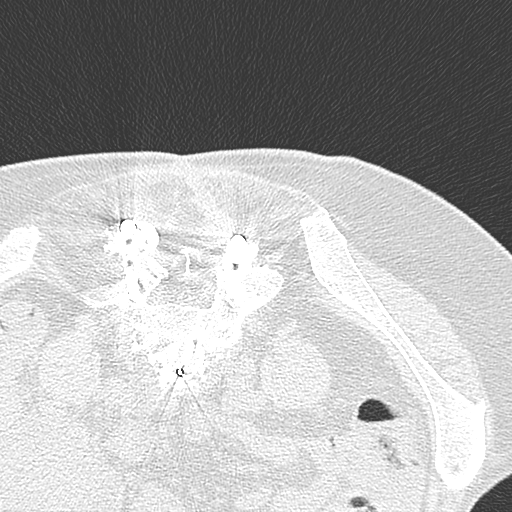

[15 of 32 positions shown; findings below may reference images not displayed]

Count of known CT and Cardiac Nuclear Medicine studies performed in the previous 
12 months = 3.
IMPRESSION: Technically successful CT-guided SI joint injection. 
RADIATION DOSE REDUCTION: All CT scans are performed using radiation dose 
reduction techniques, when applicable.  Technical factors are evaluated and 
adjusted to ensure appropriate moderation of exposure.  Automated dose 
management technology is applied to adjust the radiation doses to minimize 
exposure while achieving diagnostic quality images.
